# Patient Record
Sex: Female | Born: 1955 | Race: White | Hispanic: No | Marital: Married | Smoking: Current every day smoker
Health system: Southern US, Community
[De-identification: ages and names within clinical notes are randomized; demographics above are authoritative.]

## PROBLEM LIST (undated history)

## (undated) DIAGNOSIS — J449 Chronic obstructive pulmonary disease, unspecified: Secondary | ICD-10-CM

## (undated) DIAGNOSIS — C801 Malignant (primary) neoplasm, unspecified: Secondary | ICD-10-CM

## (undated) DIAGNOSIS — K219 Gastro-esophageal reflux disease without esophagitis: Secondary | ICD-10-CM

## (undated) HISTORY — PX: APPENDECTOMY: SHX54

## (undated) HISTORY — PX: PARTIAL HIP ARTHROPLASTY: SHX733

## (undated) HISTORY — PX: BREAST SURGERY: SHX581

## (undated) HISTORY — PX: ABDOMINAL HYSTERECTOMY: SHX81

---

## 2014-09-29 ENCOUNTER — Encounter (HOSPITAL_COMMUNITY): Payer: Self-pay | Admitting: Emergency Medicine

## 2014-09-29 ENCOUNTER — Emergency Department (HOSPITAL_COMMUNITY): Payer: Medicare Other

## 2014-09-29 ENCOUNTER — Inpatient Hospital Stay (HOSPITAL_COMMUNITY)
Admission: EM | Admit: 2014-09-29 | Discharge: 2014-10-05 | DRG: 481 | Disposition: A | Discharge status: Home / Self Care | Payer: Medicare Other | Attending: Orthopaedic Surgery | Admitting: Orthopaedic Surgery

## 2014-09-29 DIAGNOSIS — Z79899 Other long term (current) drug therapy: Secondary | ICD-10-CM | POA: Diagnosis not present

## 2014-09-29 DIAGNOSIS — Z23 Encounter for immunization: Secondary | ICD-10-CM | POA: Diagnosis not present

## 2014-09-29 DIAGNOSIS — K219 Gastro-esophageal reflux disease without esophagitis: Secondary | ICD-10-CM | POA: Diagnosis not present

## 2014-09-29 DIAGNOSIS — J449 Chronic obstructive pulmonary disease, unspecified: Secondary | ICD-10-CM | POA: Diagnosis present

## 2014-09-29 DIAGNOSIS — K5900 Constipation, unspecified: Secondary | ICD-10-CM | POA: Diagnosis not present

## 2014-09-29 DIAGNOSIS — F17210 Nicotine dependence, cigarettes, uncomplicated: Secondary | ICD-10-CM | POA: Diagnosis not present

## 2014-09-29 DIAGNOSIS — Z8541 Personal history of malignant neoplasm of cervix uteri: Secondary | ICD-10-CM | POA: Diagnosis not present

## 2014-09-29 DIAGNOSIS — W19XXXA Unspecified fall, initial encounter: Secondary | ICD-10-CM | POA: Diagnosis present

## 2014-09-29 DIAGNOSIS — Z853 Personal history of malignant neoplasm of breast: Secondary | ICD-10-CM

## 2014-09-29 DIAGNOSIS — Z86711 Personal history of pulmonary embolism: Secondary | ICD-10-CM | POA: Diagnosis present

## 2014-09-29 DIAGNOSIS — M978XXA Periprosthetic fracture around other internal prosthetic joint, initial encounter: Secondary | ICD-10-CM

## 2014-09-29 DIAGNOSIS — Y92019 Unspecified place in single-family (private) house as the place of occurrence of the external cause: Secondary | ICD-10-CM | POA: Diagnosis not present

## 2014-09-29 DIAGNOSIS — Z8543 Personal history of malignant neoplasm of ovary: Secondary | ICD-10-CM

## 2014-09-29 DIAGNOSIS — Z86718 Personal history of other venous thrombosis and embolism: Secondary | ICD-10-CM | POA: Diagnosis not present

## 2014-09-29 DIAGNOSIS — T84040A Periprosthetic fracture around internal prosthetic right hip joint, initial encounter: Secondary | ICD-10-CM | POA: Diagnosis present

## 2014-09-29 DIAGNOSIS — S72001A Fracture of unspecified part of neck of right femur, initial encounter for closed fracture: Principal | ICD-10-CM | POA: Diagnosis present

## 2014-09-29 DIAGNOSIS — Z8542 Personal history of malignant neoplasm of other parts of uterus: Secondary | ICD-10-CM | POA: Diagnosis not present

## 2014-09-29 DIAGNOSIS — S8990XA Unspecified injury of unspecified lower leg, initial encounter: Secondary | ICD-10-CM

## 2014-09-29 DIAGNOSIS — Z96649 Presence of unspecified artificial hip joint: Secondary | ICD-10-CM

## 2014-09-29 DIAGNOSIS — Z8781 Personal history of (healed) traumatic fracture: Secondary | ICD-10-CM

## 2014-09-29 DIAGNOSIS — S7291XA Unspecified fracture of right femur, initial encounter for closed fracture: Secondary | ICD-10-CM | POA: Diagnosis present

## 2014-09-29 DIAGNOSIS — Y92009 Unspecified place in unspecified non-institutional (private) residence as the place of occurrence of the external cause: Secondary | ICD-10-CM

## 2014-09-29 DIAGNOSIS — Z419 Encounter for procedure for purposes other than remedying health state, unspecified: Secondary | ICD-10-CM

## 2014-09-29 DIAGNOSIS — Z9889 Other specified postprocedural states: Secondary | ICD-10-CM

## 2014-09-29 HISTORY — DX: Gastro-esophageal reflux disease without esophagitis: K21.9

## 2014-09-29 HISTORY — DX: Chronic obstructive pulmonary disease, unspecified: J44.9

## 2014-09-29 HISTORY — DX: Malignant (primary) neoplasm, unspecified: C80.1

## 2014-09-29 LAB — URINALYSIS, ROUTINE W REFLEX MICROSCOPIC
Bilirubin Urine: NEGATIVE
Glucose, UA: NEGATIVE mg/dL
Hgb urine dipstick: NEGATIVE
KETONES UR: 15 mg/dL — AB
LEUKOCYTES UA: NEGATIVE
NITRITE: NEGATIVE
PROTEIN: NEGATIVE mg/dL
Specific Gravity, Urine: 1.018 (ref 1.005–1.030)
UROBILINOGEN UA: 0.2 mg/dL (ref 0.0–1.0)
pH: 5.5 (ref 5.0–8.0)

## 2014-09-29 LAB — CBC WITH DIFFERENTIAL/PLATELET
BASOS ABS: 0 10*3/uL (ref 0.0–0.1)
Basophils Relative: 0 % (ref 0–1)
Eosinophils Absolute: 0 10*3/uL (ref 0.0–0.7)
Eosinophils Relative: 0 % (ref 0–5)
HEMATOCRIT: 33.9 % — AB (ref 36.0–46.0)
Hemoglobin: 11.5 g/dL — ABNORMAL LOW (ref 12.0–15.0)
Lymphocytes Relative: 9 % — ABNORMAL LOW (ref 12–46)
Lymphs Abs: 1.1 10*3/uL (ref 0.7–4.0)
MCH: 33.1 pg (ref 26.0–34.0)
MCHC: 33.9 g/dL (ref 30.0–36.0)
MCV: 97.7 fL (ref 78.0–100.0)
MONO ABS: 0.7 10*3/uL (ref 0.1–1.0)
Monocytes Relative: 6 % (ref 3–12)
NEUTROS ABS: 10.1 10*3/uL — AB (ref 1.7–7.7)
Neutrophils Relative %: 85 % — ABNORMAL HIGH (ref 43–77)
PLATELETS: 227 10*3/uL (ref 150–400)
RBC: 3.47 MIL/uL — ABNORMAL LOW (ref 3.87–5.11)
RDW: 13.3 % (ref 11.5–15.5)
WBC: 11.9 10*3/uL — ABNORMAL HIGH (ref 4.0–10.5)

## 2014-09-29 LAB — BASIC METABOLIC PANEL
ANION GAP: 12 (ref 5–15)
BUN: 14 mg/dL (ref 6–23)
CALCIUM: 8.3 mg/dL — AB (ref 8.4–10.5)
CO2: 23 meq/L (ref 19–32)
CREATININE: 0.56 mg/dL (ref 0.50–1.10)
Chloride: 107 mEq/L (ref 96–112)
GFR calc Af Amer: >90 mL/min (ref >90)
Glucose, Bld: 109 mg/dL — ABNORMAL HIGH (ref 70–99)
Potassium: 3.8 mEq/L (ref 3.7–5.3)
SODIUM: 142 meq/L (ref 137–147)

## 2014-09-29 LAB — MRSA PCR SCREENING: MRSA BY PCR: NEGATIVE

## 2014-09-29 LAB — PROTIME-INR
INR: 1.21 (ref 0.00–1.49)
PROTHROMBIN TIME: 15.4 s — AB (ref 11.6–15.2)

## 2014-09-29 LAB — HEPATIC FUNCTION PANEL
ALT: 23 U/L (ref 0–35)
AST: 30 U/L (ref 0–37)
Albumin: 3.3 g/dL — ABNORMAL LOW (ref 3.5–5.2)
Alkaline Phosphatase: 51 U/L (ref 39–117)
TOTAL PROTEIN: 5.3 g/dL — AB (ref 6.0–8.3)
Total Bilirubin: 0.6 mg/dL (ref 0.3–1.2)

## 2014-09-29 LAB — ABO/RH: ABO/RH(D): B POS

## 2014-09-29 MED ORDER — MORPHINE SULFATE 4 MG/ML IJ SOLN
4.0000 mg | INTRAMUSCULAR | Status: DC | PRN
  Administered 2014-09-29 – 2014-10-01 (×4): 4 mg via INTRAVENOUS
  Filled 2014-09-29 (×4): qty 1

## 2014-09-29 MED ORDER — METHOCARBAMOL 500 MG PO TABS
500.0000 mg | ORAL_TABLET | Freq: Four times a day (QID) | ORAL | Status: DC | PRN
  Administered 2014-09-29 – 2014-10-05 (×14): 500 mg via ORAL
  Filled 2014-09-29 (×13): qty 1

## 2014-09-29 MED ORDER — SODIUM CHLORIDE 0.9 % IV SOLN
1000.0000 mL | INTRAVENOUS | Status: DC
  Administered 2014-09-29: 1000 mL via INTRAVENOUS

## 2014-09-29 MED ORDER — FENTANYL CITRATE 0.05 MG/ML IJ SOLN
100.0000 ug | Freq: Once | INTRAMUSCULAR | Status: AC
  Administered 2014-09-29: 100 ug via INTRAVENOUS
  Filled 2014-09-29: qty 2

## 2014-09-29 MED ORDER — SODIUM CHLORIDE 0.9 % IV SOLN
INTRAVENOUS | Status: DC
  Administered 2014-09-29: 16:00:00 via INTRAVENOUS

## 2014-09-29 MED ORDER — OXYCODONE HCL 5 MG PO TABS
5.0000 mg | ORAL_TABLET | ORAL | Status: DC | PRN
  Administered 2014-09-29: 10 mg via ORAL
  Filled 2014-09-29: qty 2

## 2014-09-29 MED ORDER — ONDANSETRON HCL 4 MG/2ML IJ SOLN: 4.0000 mg | Freq: Four times a day (QID) | INTRAMUSCULAR | Status: DC

## 2014-09-29 MED ORDER — MORPHINE BOLUS VIA INFUSION: 4.0000 mg | INTRAVENOUS | Status: DC | PRN

## 2014-09-29 MED ORDER — DOCUSATE SODIUM 100 MG PO CAPS
100.0000 mg | ORAL_CAPSULE | Freq: Two times a day (BID) | ORAL | Status: DC
  Administered 2014-09-29 – 2014-10-05 (×12): 100 mg via ORAL
  Filled 2014-09-29 (×18): qty 1

## 2014-09-29 MED ORDER — ENOXAPARIN SODIUM 40 MG/0.4ML ~~LOC~~ SOLN
40.0000 mg | SUBCUTANEOUS | Status: DC
  Administered 2014-09-29 – 2014-10-02 (×4): 40 mg via SUBCUTANEOUS
  Filled 2014-09-29 (×5): qty 0.4

## 2014-09-29 MED ORDER — ONDANSETRON HCL 4 MG/2ML IJ SOLN
4.0000 mg | Freq: Four times a day (QID) | INTRAMUSCULAR | Status: DC | PRN
  Administered 2014-09-29: 4 mg via INTRAVENOUS
  Filled 2014-09-29: qty 2

## 2014-09-29 MED ORDER — MORPHINE SULFATE 4 MG/ML IJ SOLN
4.0000 mg | Freq: Once | INTRAMUSCULAR | Status: AC
  Administered 2014-09-29: 4 mg via INTRAVENOUS
  Filled 2014-09-29: qty 1

## 2014-09-29 MED ORDER — HYDROCODONE-ACETAMINOPHEN 5-325 MG PO TABS
1.0000 | ORAL_TABLET | Freq: Four times a day (QID) | ORAL | Status: DC | PRN
  Administered 2014-09-29 – 2014-10-05 (×13): 2 via ORAL
  Filled 2014-09-29 (×13): qty 2

## 2014-09-29 MED ORDER — METHOCARBAMOL 1000 MG/10ML IJ SOLN
500.0000 mg | Freq: Four times a day (QID) | INTRAVENOUS | Status: DC | PRN
  Filled 2014-09-29: qty 5

## 2014-09-29 MED ORDER — SODIUM CHLORIDE 0.9 % IV BOLUS (SEPSIS)
1000.0000 mL | Freq: Once | INTRAVENOUS | Status: AC
  Administered 2014-09-29: 1000 mL via INTRAVENOUS

## 2014-09-29 NOTE — ED Notes (Signed)
Patient transported to X-ray 

## 2014-09-29 NOTE — ED Notes (Signed)
Orthopedic surgery at bedside

## 2014-09-29 NOTE — ED Notes (Addendum)
Felt right hip give away while walking and fell, EMS reports possible femur fracture. They placed the right leg in traction. Pulses positive. Swelling to right upper leg. 274mcg Fentanyl; 4mg  Zofran given in route. Fell onto tile floor, denies hitting head or any LOC. Patient reports bilat hip replacements three times; most recently the right hip in 2010.

## 2014-09-29 NOTE — ED Notes (Signed)
Dr. Lorin Mercy removed EMS traction device.

## 2014-09-29 NOTE — H&P (Addendum)
Diana Wheeler is an 58 y.o. female.   Chief Complaint: fall with right periprothetic femur fracture.   Previous THA times 3.  ( 2 revisions) she states she used to weigh 527 lbs and gradually lost over 3 yrs down to 145 lbs.     [address] getting up to go to bathroom.  Femur cracked.  HPI: last revision for right hip was 2010 for poly wear.   Past Medical History  Diagnosis Date  . COPD (chronic obstructive pulmonary disease)   . Cancer     cervical, ovarina, uterine, breast  . GERD (gastroesophageal reflux disease)     Past Surgical History  Procedure Laterality Date  . Breast surgery    . Abdominal hysterectomy    . Appendectomy    . Partial hip arthroplasty      reports bilat x3     Previous breat CA with breast biopsy then  then implants History reviewed. No pertinent family history. Social History:  reports that she has been smoking Cigarettes.  She has been smoking about 0.00 packs per day. She does not have any smokeless tobacco history on file. She reports that she drinks alcohol. She reports that she does not use illicit drugs.  Allergies: No Known Allergies   (Not in a hospital admission)  Results for orders placed or performed during the hospital encounter of 09/29/14 (from the past 48 hour(s))  Urinalysis, Routine w reflex microscopic     Status: Abnormal   Collection Time: 09/29/14 12:20 PM  Result Value Ref Range   Color, Urine YELLOW YELLOW   APPearance CLEAR CLEAR   Specific Gravity, Urine 1.018 1.005 - 1.030   pH 5.5 5.0 - 8.0   Glucose, UA NEGATIVE NEGATIVE mg/dL   Hgb urine dipstick NEGATIVE NEGATIVE   Bilirubin Urine NEGATIVE NEGATIVE   Ketones, ur 15 (A) NEGATIVE mg/dL   Protein, ur NEGATIVE NEGATIVE mg/dL   Urobilinogen, UA 0.2 0.0 - 1.0 mg/dL   Nitrite NEGATIVE NEGATIVE   Leukocytes, UA NEGATIVE NEGATIVE    Comment: MICROSCOPIC NOT DONE ON URINES WITH NEGATIVE PROTEIN, BLOOD, LEUKOCYTES, NITRITE, OR GLUCOSE <1000 mg/dL.  CBC WITH DIFFERENTIAL      Status: Abnormal   Collection Time: 09/29/14  2:47 PM  Result Value Ref Range   WBC 11.9 (H) 4.0 - 10.5 K/uL   RBC 3.47 (L) 3.87 - 5.11 MIL/uL   Hemoglobin 11.5 (L) 12.0 - 15.0 g/dL   HCT 33.9 (L) 36.0 - 46.0 %   MCV 97.7 78.0 - 100.0 fL   MCH 33.1 26.0 - 34.0 pg   MCHC 33.9 30.0 - 36.0 g/dL   RDW 13.3 11.5 - 15.5 %   Platelets 227 150 - 400 K/uL   Neutrophils Relative % 85 (H) 43 - 77 %   Neutro Abs 10.1 (H) 1.7 - 7.7 K/uL   Lymphocytes Relative 9 (L) 12 - 46 %   Lymphs Abs 1.1 0.7 - 4.0 K/uL   Monocytes Relative 6 3 - 12 %   Monocytes Absolute 0.7 0.1 - 1.0 K/uL   Eosinophils Relative 0 0 - 5 %   Eosinophils Absolute 0.0 0.0 - 0.7 K/uL   Basophils Relative 0 0 - 1 %   Basophils Absolute 0.0 0.0 - 0.1 K/uL   Dg Pelvis 1-2 Views  09/29/2014   CLINICAL DATA:  Patient was walking and fell pain in the right hip.  EXAM: PELVIS - 1-2 VIEW  COMPARISON:  None.  FINDINGS: Bilateral hip replacements are noted. No  acute fracture or dislocation is noted. No soft tissue abnormality is seen.  IMPRESSION: No acute abnormality noted.   Electronically Signed   By: Inez Catalina M.D.   On: 09/29/2014 14:50   Dg Femur Right  09/29/2014   CLINICAL DATA:  Walking with sudden pain and inability to support weight, additional encounter  EXAM: RIGHT FEMUR - 2 VIEW  COMPARISON:  None.  FINDINGS: There is a periprosthetic fracture of the proximal right femoral diaphysis. Angulation at the fracture site is noted. No other fractures are seen. Curvilinear densities are noted within the soft tissues in the right inguinal region and proximal thigh of uncertain significance. No other focal abnormality is noted.  IMPRESSION: Proximal right femoral fracture surrounding the distal aspect of the prosthesis.   Electronically Signed   By: Inez Catalina M.D.   On: 09/29/2014 14:53    ROS  Blood pressure 107/55, pulse 81, temperature 98.1 F (36.7 C), temperature source Oral, resp. rate 20, SpO2 96 %. Physical Exam   Constitutional: She is oriented to person, place, and time. She appears well-developed and well-nourished.  HENT:  Head: Normocephalic.  Upper and lower dentures  Neck:  Previous ACDF  Cardiovascular: Normal rate and regular rhythm.   Respiratory: Effort normal.  GI: Soft.  Musculoskeletal:  Right leg short and ER. Pulses 2 plus  Neurological: She is alert and oriented to person, place, and time.  Skin: Skin is warm and dry.  Psychiatric: She has a normal mood and affect. Her behavior is normal. Thought content normal.     Assessment/Plan righrt periprothetic femur fracture. Plan admission , will need ORIF of right femur. Cable plate system will be required. Discussed with pt and discussed.  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 WILL NEED COUMADIN OR XERALTO  POST OP Diana Wheeler C 09/29/2014, 3:21 PM

## 2014-09-29 NOTE — ED Notes (Signed)
Consent signed by patient with Dr. Lorin Mercy at bedside.

## 2014-09-29 NOTE — Progress Notes (Signed)
Orthopedic Tech Progress Note Patient Details:  Diana Wheeler 24-Dec-1955 [id number]  Musculoskeletal Traction Type of Traction: [place] Skin Traction Traction Location: RLE Traction Weight: 5 lbs    Braulio Bosch 09/29/2014, 5:47 PM

## 2014-09-29 NOTE — ED Notes (Signed)
Patient returned from X-ray 

## 2014-09-29 NOTE — ED Provider Notes (Signed)
CSN: [id number]     Arrival date & time 09/29/14  1153 History   First MD Initiated Contact with Patient 09/29/14 1211     Chief Complaint  Patient presents with  . Leg Injury  . Hip Pain      HPI Comments: Ms. Hyslop presents for right hip pain following fall. She is here visiting her sister when she went up to use the bathroom and felt her right hip pop and she fell to the ground. This started just prior to ED arrival. She reports a lot of pain in the right hip and thigh with a large amount of swelling locally. She states that her leg feels better after being placed in traction by EMS. She has a history of prior bilateral hip replacements. She denies history of dislocation, but has been having problems with that hip recently. She did not hit her head or pass out. She reports a medical history of low blood pressures.  Symptoms are moderate, constant, and nonradiating.  Patient is a 58 y.o. female presenting with hip pain. The history is provided by the patient.  Hip Pain    Past Medical History  Diagnosis Date  . COPD (chronic obstructive pulmonary disease)   . Cancer     cervical, ovarina, uterine, breast  . GERD (gastroesophageal reflux disease)    Past Surgical History  Procedure Laterality Date  . Breast surgery    . Abdominal hysterectomy    . Appendectomy    . Partial hip arthroplasty      reports bilat x3   History reviewed. No pertinent family history. History  Substance Use Topics  . Smoking status: Current Every Day Smoker    Types: Cigarettes  . Smokeless tobacco: Not on file  . Alcohol Use: Yes     Comment: occasionally   OB History    No data available     Review of Systems  All other systems reviewed and are negative.     Allergies  Review of patient's allergies indicates no known allergies.  Home Medications   Prior to Admission medications   Not on File   BP 100/54 mmHg  Pulse 91  Temp(Src) 98.5 F (36.9 C) (Oral)  Resp 20  SpO2  92% Physical Exam  Constitutional: She is oriented to person, place, and time. She appears well-developed and well-nourished. She appears distressed.  HENT:  Head: Normocephalic and atraumatic.  Cardiovascular: Normal rate and regular rhythm.   No murmur heard. Pulmonary/Chest: Effort normal and breath sounds normal. No respiratory distress.  Abdominal: Soft. There is no tenderness. There is no rebound and no guarding.  Musculoskeletal:  2+ femoral and DP pulses bilaterally. Moderate tenderness over the right hip and thigh. Right lower extremity in traction.  Neurological: She is alert and oriented to person, place, and time.  Sensation to light touch intact in bilateral lower extremities  Skin: Skin is warm and dry.  Psychiatric: She has a normal mood and affect. Her behavior is normal.  Nursing note and vitals reviewed.   ED Course  Procedures (including critical care time) Labs Review Labs Reviewed  URINALYSIS, ROUTINE W REFLEX MICROSCOPIC - Abnormal; Notable for the following:    Ketones, ur 15 (*)    All other components within normal limits  BASIC METABOLIC PANEL - Abnormal; Notable for the following:    Glucose, Bld 109 (*)    Calcium 8.3 (*)    All other components within normal limits  CBC WITH DIFFERENTIAL - Abnormal; Notable  for the following:    WBC 11.9 (*)    RBC 3.47 (*)    Hemoglobin 11.5 (*)    HCT 33.9 (*)    Neutrophils Relative % 85 (*)    Neutro Abs 10.1 (*)    Lymphocytes Relative 9 (*)    All other components within normal limits  PROTIME-INR - Abnormal; Notable for the following:    Prothrombin Time 15.4 (*)    All other components within normal limits  HEPATIC FUNCTION PANEL - Abnormal; Notable for the following:    Total Protein 5.3 (*)    Albumin 3.3 (*)    All other components within normal limits  MRSA PCR SCREENING  VITAMIN D 25 HYDROXY  TYPE AND SCREEN  ABO/RH  PREPARE RBC (CROSSMATCH)    Imaging Review Dg Pelvis 1-2  Views  09/29/2014   CLINICAL DATA:  Patient was walking and fell pain in the right hip.  EXAM: PELVIS - 1-2 VIEW  COMPARISON:  None.  FINDINGS: Bilateral hip replacements are noted. No acute fracture or dislocation is noted. No soft tissue abnormality is seen.  IMPRESSION: No acute abnormality noted.   Electronically Signed   By: Inez Catalina M.D.   On: 09/29/2014 14:50   Dg Femur Right  09/29/2014   CLINICAL DATA:  Walking with sudden pain and inability to support weight, additional encounter  EXAM: RIGHT FEMUR - 2 VIEW  COMPARISON:  None.  FINDINGS: There is a periprosthetic fracture of the proximal right femoral diaphysis. Angulation at the fracture site is noted. No other fractures are seen. Curvilinear densities are noted within the soft tissues in the right inguinal region and proximal thigh of uncertain significance. No other focal abnormality is noted.  IMPRESSION: Proximal right femoral fracture surrounding the distal aspect of the prosthesis.   Electronically Signed   By: Inez Catalina M.D.   On: 09/29/2014 14:53   Dg Chest Port 1 View  09/29/2014   CLINICAL DATA:  58 year old for preoperative respiratory examination for right hip surgery.  EXAM: PORTABLE CHEST - 1 VIEW  COMPARISON:  None.  FINDINGS: The cardiomediastinal silhouette is unremarkable.  There is no evidence of focal airspace disease, pulmonary edema, suspicious pulmonary nodule/mass, pleural effusion, or pneumothorax. No acute bony abnormalities are identified.  Lower cervical fusion changes noted.  IMPRESSION: No active disease.   Electronically Signed   By: Hassan Rowan M.D.   On: 09/29/2014 15:36     EKG Interpretation   Date/Time:  Saturday September 29 2014 16:05:10 EST Ventricular Rate:  95 PR Interval:  141 QRS Duration: 80 QT Interval:  341 QTC Calculation: 429 R Axis:   79 Text Interpretation:  Sinus rhythm Low voltage, extremity and precordial  leads Nonspecific ST abnormality Confirmed by Hazle Coca [phone]) on   09/29/2014 4:17:17 PM     Patient with right periprosthetic femur fracture. Discussed with Dr. Lorin Mercy with orthopedics he will come in to see the patient. MDM   Final diagnoses:  Fall at home, initial encounter  Peri-prosthetic femur fracture at tip of prosthesis, initial encounter    Patient here for evaluation of hip injury following fall. Patient has no evidence of additional injuries and denies hitting her head. She did not have any syncope or presyncopal symptoms prior to falling.    Quintella Reichert, MD 09/30/14 [phone]

## 2014-09-30 ENCOUNTER — Inpatient Hospital Stay (HOSPITAL_COMMUNITY): Payer: Medicare Other

## 2014-09-30 ENCOUNTER — Inpatient Hospital Stay (HOSPITAL_COMMUNITY): Payer: Medicare Other | Admitting: Anesthesiology

## 2014-09-30 ENCOUNTER — Encounter (HOSPITAL_COMMUNITY)
Admission: EM | Disposition: A | Discharge status: Home / Self Care | Payer: Self-pay | Source: Home / Self Care | Attending: Orthopaedic Surgery

## 2014-09-30 ENCOUNTER — Encounter (HOSPITAL_COMMUNITY): Payer: Self-pay | Admitting: Certified Registered"

## 2014-09-30 DIAGNOSIS — Z86711 Personal history of pulmonary embolism: Secondary | ICD-10-CM | POA: Diagnosis present

## 2014-09-30 HISTORY — PX: ORIF HIP FRACTURE: SHX2125

## 2014-09-30 LAB — POCT I-STAT 4, (NA,K, GLUC, HGB,HCT)
Glucose, Bld: 112 mg/dL — ABNORMAL HIGH (ref 70–99)
Glucose, Bld: 113 mg/dL — ABNORMAL HIGH (ref 70–99)
HCT: 31 % — ABNORMAL LOW (ref 36.0–46.0)
HEMATOCRIT: 30 % — AB (ref 36.0–46.0)
HEMOGLOBIN: 10.2 g/dL — AB (ref 12.0–15.0)
Hemoglobin: 10.5 g/dL — ABNORMAL LOW (ref 12.0–15.0)
Potassium: 4.1 mEq/L (ref 3.7–5.3)
Potassium: 3.7 mEq/L (ref 3.7–5.3)
Sodium: 138 mEq/L (ref 137–147)
Sodium: 139 mEq/L (ref 137–147)

## 2014-09-30 LAB — PREPARE RBC (CROSSMATCH)

## 2014-09-30 SURGERY — OPEN REDUCTION INTERNAL FIXATION HIP
Anesthesia: General | Site: Hip | Laterality: Right

## 2014-09-30 MED ORDER — ACETAMINOPHEN 650 MG RE SUPP: 650.0000 mg | Freq: Four times a day (QID) | RECTAL | Status: DC | PRN

## 2014-09-30 MED ORDER — CLONAZEPAM 0.5 MG PO TABS
0.5000 mg | ORAL_TABLET | Freq: Every day | ORAL | Status: DC
  Administered 2014-09-30 – 2014-10-05 (×6): 0.5 mg via ORAL
  Filled 2014-09-30 (×6): qty 1

## 2014-09-30 MED ORDER — POLYETHYLENE GLYCOL 3350 17 G PO PACK
17.0000 g | PACK | Freq: Every day | ORAL | Status: DC | PRN
  Administered 2014-09-30 – 2014-10-05 (×5): 17 g via ORAL
  Filled 2014-09-30 (×6): qty 1

## 2014-09-30 MED ORDER — VECURONIUM BROMIDE 10 MG IV SOLR
INTRAVENOUS | Status: AC
  Filled 2014-09-30: qty 10

## 2014-09-30 MED ORDER — SODIUM CHLORIDE 0.45 % IV SOLN
INTRAVENOUS | Status: DC
  Administered 2014-09-30: 23:00:00 via INTRAVENOUS

## 2014-09-30 MED ORDER — LACTATED RINGERS IV SOLN
INTRAVENOUS | Status: DC | PRN
  Administered 2014-09-30 (×4): via INTRAVENOUS

## 2014-09-30 MED ORDER — EPHEDRINE SULFATE 50 MG/ML IJ SOLN
INTRAMUSCULAR | Status: DC | PRN
  Administered 2014-09-30: 15 mg via INTRAVENOUS
  Administered 2014-09-30: 10 mg via INTRAVENOUS

## 2014-09-30 MED ORDER — POTASSIUM CHLORIDE CRYS ER 20 MEQ PO TBCR: 20.0000 meq | EXTENDED_RELEASE_TABLET | Freq: Every day | ORAL | Status: DC | PRN

## 2014-09-30 MED ORDER — ONDANSETRON HCL 4 MG/2ML IJ SOLN
INTRAMUSCULAR | Status: DC | PRN
  Administered 2014-09-30: 4 mg via INTRAVENOUS

## 2014-09-30 MED ORDER — COUMADIN BOOK
Freq: Once | Status: AC
  Administered 2014-09-30: 14:00:00
  Filled 2014-09-30: qty 1

## 2014-09-30 MED ORDER — ACETAMINOPHEN 325 MG PO TABS
ORAL_TABLET | ORAL | Status: AC
  Administered 2014-09-30: 650 mg via ORAL
  Filled 2014-09-30: qty 2

## 2014-09-30 MED ORDER — ONDANSETRON HCL 4 MG/2ML IJ SOLN
4.0000 mg | Freq: Four times a day (QID) | INTRAMUSCULAR | Status: DC | PRN
Start: 2014-09-30 — End: 2014-09-30

## 2014-09-30 MED ORDER — GLYCOPYRROLATE 0.2 MG/ML IJ SOLN
INTRAMUSCULAR | Status: DC | PRN
  Administered 2014-09-30: 0.4 mg via INTRAVENOUS

## 2014-09-30 MED ORDER — CEFAZOLIN SODIUM-DEXTROSE 2-3 GM-% IV SOLR
2.0000 g | Freq: Four times a day (QID) | INTRAVENOUS | Status: AC
Start: 2014-09-30 — End: 2014-09-30
  Administered 2014-09-30 (×2): 2 g via INTRAVENOUS
  Filled 2014-09-30 (×2): qty 50

## 2014-09-30 MED ORDER — ONDANSETRON HCL 4 MG/2ML IJ SOLN
4.0000 mg | Freq: Four times a day (QID) | INTRAMUSCULAR | Status: DC | PRN
  Administered 2014-09-30: 4 mg via INTRAVENOUS
  Filled 2014-09-30 (×2): qty 2

## 2014-09-30 MED ORDER — BISACODYL 10 MG RE SUPP: 10.0000 mg | Freq: Every day | RECTAL | Status: DC | PRN

## 2014-09-30 MED ORDER — PHENOL 1.4 % MT LIQD: 1.0000 | OROMUCOSAL | Status: DC | PRN

## 2014-09-30 MED ORDER — ROCURONIUM BROMIDE 100 MG/10ML IV SOLN
INTRAVENOUS | Status: DC | PRN
  Administered 2014-09-30: 40 mg via INTRAVENOUS

## 2014-09-30 MED ORDER — BUPIVACAINE HCL (PF) 0.25 % IJ SOLN
INTRAMUSCULAR | Status: DC | PRN
  Administered 2014-09-30: 12 mL

## 2014-09-30 MED ORDER — METOCLOPRAMIDE HCL 5 MG/ML IJ SOLN: 5.0000 mg | Freq: Three times a day (TID) | INTRAMUSCULAR | Status: DC | PRN

## 2014-09-30 MED ORDER — FLEET ENEMA 7-19 GM/118ML RE ENEM: 1.0000 | ENEMA | Freq: Once | RECTAL | Status: AC | PRN

## 2014-09-30 MED ORDER — LIDOCAINE HCL (CARDIAC) 20 MG/ML IV SOLN
INTRAVENOUS | Status: DC | PRN
  Administered 2014-09-30: 60 mg via INTRAVENOUS

## 2014-09-30 MED ORDER — FUROSEMIDE 40 MG PO TABS
40.0000 mg | ORAL_TABLET | Freq: Every day | ORAL | Status: DC | PRN
  Filled 2014-09-30: qty 1

## 2014-09-30 MED ORDER — MENTHOL 3 MG MT LOZG: 1.0000 | LOZENGE | OROMUCOSAL | Status: DC | PRN

## 2014-09-30 MED ORDER — ONDANSETRON HCL 4 MG/2ML IJ SOLN
INTRAMUSCULAR | Status: AC
  Filled 2014-09-30: qty 2

## 2014-09-30 MED ORDER — NEOSTIGMINE METHYLSULFATE 10 MG/10ML IV SOLN
INTRAVENOUS | Status: AC
  Filled 2014-09-30: qty 1

## 2014-09-30 MED ORDER — DEXAMETHASONE SODIUM PHOSPHATE 4 MG/ML IJ SOLN
INTRAMUSCULAR | Status: AC
  Filled 2014-09-30: qty 1

## 2014-09-30 MED ORDER — PHENYLEPHRINE HCL 10 MG/ML IJ SOLN
INTRAMUSCULAR | Status: DC | PRN
  Administered 2014-09-30: 80 ug via INTRAVENOUS

## 2014-09-30 MED ORDER — HYDROMORPHONE HCL 1 MG/ML IJ SOLN
INTRAMUSCULAR | Status: AC
  Filled 2014-09-30: qty 1

## 2014-09-30 MED ORDER — OXYCODONE HCL 5 MG PO TABS: 5.0000 mg | ORAL_TABLET | Freq: Once | ORAL | Status: DC | PRN

## 2014-09-30 MED ORDER — CEFAZOLIN SODIUM-DEXTROSE 2-3 GM-% IV SOLR
INTRAVENOUS | Status: DC | PRN
  Administered 2014-09-30: 2 g via INTRAVENOUS

## 2014-09-30 MED ORDER — VECURONIUM BROMIDE 10 MG IV SOLR
INTRAVENOUS | Status: DC | PRN
  Administered 2014-09-30: 3 mg via INTRAVENOUS
  Administered 2014-09-30: 1 mg via INTRAVENOUS

## 2014-09-30 MED ORDER — TIOTROPIUM BROMIDE MONOHYDRATE 18 MCG IN CAPS
18.0000 ug | ORAL_CAPSULE | Freq: Every day | RESPIRATORY_TRACT | Status: DC
  Administered 2014-09-30 – 2014-10-05 (×6): 18 ug via RESPIRATORY_TRACT
  Filled 2014-09-30 (×2): qty 5

## 2014-09-30 MED ORDER — METOCLOPRAMIDE HCL 10 MG PO TABS: 5.0000 mg | ORAL_TABLET | Freq: Three times a day (TID) | ORAL | Status: DC | PRN

## 2014-09-30 MED ORDER — BUPIVACAINE HCL (PF) 0.25 % IJ SOLN
INTRAMUSCULAR | Status: AC
  Filled 2014-09-30: qty 30

## 2014-09-30 MED ORDER — NEOSTIGMINE METHYLSULFATE 10 MG/10ML IV SOLN
INTRAVENOUS | Status: DC | PRN
  Administered 2014-09-30: 2 mg via INTRAVENOUS

## 2014-09-30 MED ORDER — HYDROCODONE-ACETAMINOPHEN 5-325 MG PO TABS: 1.0000 | ORAL_TABLET | Freq: Four times a day (QID) | ORAL | Status: DC | PRN

## 2014-09-30 MED ORDER — OXYCODONE HCL 5 MG PO TABS
5.0000 mg | ORAL_TABLET | ORAL | Status: DC | PRN
  Administered 2014-09-30 – 2014-10-05 (×17): 10 mg via ORAL
  Filled 2014-09-30 (×16): qty 2

## 2014-09-30 MED ORDER — WARFARIN VIDEO: Freq: Once | Status: DC

## 2014-09-30 MED ORDER — MIDAZOLAM HCL 2 MG/2ML IJ SOLN
INTRAMUSCULAR | Status: AC
Start: 2014-09-30 — End: 2014-09-30
  Filled 2014-09-30: qty 2

## 2014-09-30 MED ORDER — METHOCARBAMOL 500 MG PO TABS
ORAL_TABLET | ORAL | Status: AC
  Administered 2014-09-30: 500 mg via ORAL
  Filled 2014-09-30: qty 1

## 2014-09-30 MED ORDER — PHENYLEPHRINE HCL 10 MG/ML IJ SOLN
10.0000 mg | INTRAVENOUS | Status: DC | PRN
  Administered 2014-09-30: 80 ug/min via INTRAVENOUS

## 2014-09-30 MED ORDER — ZOLPIDEM TARTRATE 5 MG PO TABS: 5.0000 mg | ORAL_TABLET | Freq: Every evening | ORAL | Status: DC | PRN

## 2014-09-30 MED ORDER — WARFARIN - PHARMACIST DOSING INPATIENT: Freq: Every day | Status: DC

## 2014-09-30 MED ORDER — DEXAMETHASONE SODIUM PHOSPHATE 4 MG/ML IJ SOLN
INTRAMUSCULAR | Status: DC | PRN
  Administered 2014-09-30: 4 mg via INTRAVENOUS

## 2014-09-30 MED ORDER — WARFARIN SODIUM 5 MG PO TABS
5.0000 mg | ORAL_TABLET | Freq: Once | ORAL | Status: AC
  Administered 2014-09-30: 5 mg via ORAL
  Filled 2014-09-30: qty 1

## 2014-09-30 MED ORDER — ONDANSETRON HCL 4 MG PO TABS
4.0000 mg | ORAL_TABLET | Freq: Four times a day (QID) | ORAL | Status: DC | PRN
  Administered 2014-09-30: 4 mg via ORAL
  Filled 2014-09-30: qty 1

## 2014-09-30 MED ORDER — HYDROMORPHONE HCL 1 MG/ML IJ SOLN
0.2500 mg | INTRAMUSCULAR | Status: DC | PRN
  Administered 2014-09-30 (×3): 0.5 mg via INTRAVENOUS

## 2014-09-30 MED ORDER — FENTANYL CITRATE 0.05 MG/ML IJ SOLN
INTRAMUSCULAR | Status: AC
  Filled 2014-09-30: qty 5

## 2014-09-30 MED ORDER — FENTANYL CITRATE 0.05 MG/ML IJ SOLN
INTRAMUSCULAR | Status: DC | PRN
  Administered 2014-09-30: 50 ug via INTRAVENOUS
  Administered 2014-09-30: 100 ug via INTRAVENOUS
  Administered 2014-09-30 (×2): 50 ug via INTRAVENOUS

## 2014-09-30 MED ORDER — DOCUSATE SODIUM 100 MG PO CAPS: 100.0000 mg | ORAL_CAPSULE | Freq: Two times a day (BID) | ORAL | Status: DC

## 2014-09-30 MED ORDER — OXYCODONE HCL 5 MG/5ML PO SOLN: 5.0000 mg | Freq: Once | ORAL | Status: DC | PRN

## 2014-09-30 MED ORDER — PROPOFOL 10 MG/ML IV BOLUS
INTRAVENOUS | Status: DC | PRN
  Administered 2014-09-30: 130 mg via INTRAVENOUS

## 2014-09-30 MED ORDER — HYDROMORPHONE HCL 1 MG/ML IJ SOLN
INTRAMUSCULAR | Status: AC
  Administered 2014-09-30: 0.5 mg via INTRAVENOUS
  Filled 2014-09-30: qty 1

## 2014-09-30 MED ORDER — ALBUTEROL SULFATE (2.5 MG/3ML) 0.083% IN NEBU: 2.5000 mg | INHALATION_SOLUTION | Freq: Four times a day (QID) | RESPIRATORY_TRACT | Status: DC | PRN

## 2014-09-30 MED ORDER — MIDAZOLAM HCL 5 MG/5ML IJ SOLN
INTRAMUSCULAR | Status: DC | PRN
  Administered 2014-09-30: 2 mg via INTRAVENOUS

## 2014-09-30 MED ORDER — GLYCOPYRROLATE 0.2 MG/ML IJ SOLN
INTRAMUSCULAR | Status: AC
  Filled 2014-09-30: qty 2

## 2014-09-30 MED ORDER — ACETAMINOPHEN 325 MG PO TABS
650.0000 mg | ORAL_TABLET | Freq: Four times a day (QID) | ORAL | Status: DC | PRN
  Administered 2014-09-30: 650 mg via ORAL

## 2014-09-30 MED ORDER — HYDROMORPHONE HCL 1 MG/ML IJ SOLN
0.1000 mg | INTRAMUSCULAR | Status: DC | PRN
  Administered 2014-09-30: 0.1 mg via INTRAVENOUS
  Filled 2014-09-30: qty 1

## 2014-09-30 MED ORDER — ALBUTEROL SULFATE HFA 108 (90 BASE) MCG/ACT IN AERS: 2.0000 | INHALATION_SPRAY | Freq: Four times a day (QID) | RESPIRATORY_TRACT | Status: DC | PRN

## 2014-09-30 MED ORDER — OXYCODONE HCL 5 MG PO TABS
ORAL_TABLET | ORAL | Status: AC
  Administered 2014-09-30: 10 mg via ORAL
  Filled 2014-09-30: qty 2

## 2014-09-30 SURGICAL SUPPLY — 49 items
BIT DRILL 4.8MMDIAX5IN DISPOSE (BIT) ×1 IMPLANT
CABLE FOR BONE PLATE (Cable) ×18 IMPLANT
CUFF TOURNIQUET SINGLE 34IN LL (TOURNIQUET CUFF) IMPLANT
CUFF TOURNIQUET SINGLE 44IN (TOURNIQUET CUFF) IMPLANT
DRAPE IMP U-DRAPE 54X76 (DRAPES) ×3 IMPLANT
DRAPE STERI IOBAN 125X83 (DRAPES) ×3 IMPLANT
DRAPE SURG 17X23 STRL (DRAPES) ×3 IMPLANT
DRILL BIT 4.8MMDIAX5IN DISPOSE (BIT) ×3
DRSG ADAPTIC 3X8 NADH LF (GAUZE/BANDAGES/DRESSINGS) ×3 IMPLANT
DRSG PAD ABDOMINAL 8X10 ST (GAUZE/BANDAGES/DRESSINGS) ×3 IMPLANT
DURAPREP 26ML APPLICATOR (WOUND CARE) ×3 IMPLANT
ELECT CAUTERY BLADE 6.4 (BLADE) ×3 IMPLANT
ELECT REM PT RETURN 9FT ADLT (ELECTROSURGICAL) ×3
ELECTRODE REM PT RTRN 9FT ADLT (ELECTROSURGICAL) ×1 IMPLANT
EVACUATOR 1/8 PVC DRAIN (DRAIN) IMPLANT
GAUZE SPONGE 4X4 12PLY STRL (GAUZE/BANDAGES/DRESSINGS) ×3 IMPLANT
GAUZE XEROFORM 1X8 LF (GAUZE/BANDAGES/DRESSINGS) ×3 IMPLANT
GLOVE BIOGEL PI IND STRL 7.5 (GLOVE) ×3 IMPLANT
GLOVE BIOGEL PI IND STRL 8 (GLOVE) ×2 IMPLANT
GLOVE BIOGEL PI INDICATOR 7.5 (GLOVE) ×6
GLOVE BIOGEL PI INDICATOR 8 (GLOVE) ×4
GLOVE ECLIPSE 7.0 STRL STRAW (GLOVE) ×9 IMPLANT
GLOVE ORTHO TXT STRL SZ7.5 (GLOVE) ×3 IMPLANT
GOWN STRL REUS W/ TWL LRG LVL3 (GOWN DISPOSABLE) ×3 IMPLANT
GOWN STRL REUS W/TWL LRG LVL3 (GOWN DISPOSABLE) ×6
HEAD MODULAR 50MMX21MMX57MM (Orthopedic Implant) ×1 IMPLANT
KIT BASIN OR (CUSTOM PROCEDURE TRAY) ×3 IMPLANT
KIT ROOM TURNOVER OR (KITS) ×3 IMPLANT
MANIFOLD NEPTUNE II (INSTRUMENTS) ×3 IMPLANT
MODULAR HEAD 50MMX21MMX57MM (Orthopedic Implant) ×3 IMPLANT
NS IRRIG 1000ML POUR BTL (IV SOLUTION) ×3 IMPLANT
PACK GENERAL/GYN (CUSTOM PROCEDURE TRAY) ×3 IMPLANT
PACK UNIVERSAL I (CUSTOM PROCEDURE TRAY) ×3 IMPLANT
PAD ARMBOARD 7.5X6 YLW CONV (MISCELLANEOUS) ×9 IMPLANT
PLATE CABLE BONE 6HOLE (Plate) ×3 IMPLANT
SCREW CORT 3.5X38X4.5XST LG (Screw) ×1 IMPLANT
SCREW CORTICAL 4.5X38MM (Screw) ×2 IMPLANT
SPONGE GAUZE 4X4 12PLY STER LF (GAUZE/BANDAGES/DRESSINGS) ×3 IMPLANT
STAPLER VISISTAT 35W (STAPLE) ×3 IMPLANT
SUT VIC AB 0 CT1 27 (SUTURE) ×12
SUT VIC AB 0 CT1 27XBRD ANBCTR (SUTURE) ×6 IMPLANT
SUT VIC AB 1 CT1 27 (SUTURE) ×2
SUT VIC AB 1 CT1 27XBRD ANBCTR (SUTURE) ×1 IMPLANT
SUT VIC AB 2-0 CT1 27 (SUTURE) ×2
SUT VIC AB 2-0 CT1 TAPERPNT 27 (SUTURE) ×1 IMPLANT
TAPE CLOTH SURG 6X10 WHT LF (GAUZE/BANDAGES/DRESSINGS) ×3 IMPLANT
TOWEL OR 17X24 6PK STRL BLUE (TOWEL DISPOSABLE) ×3 IMPLANT
TOWEL OR 17X26 10 PK STRL BLUE (TOWEL DISPOSABLE) ×3 IMPLANT
WATER STERILE IRR 1000ML POUR (IV SOLUTION) ×3 IMPLANT

## 2014-09-30 NOTE — Op Note (Signed)
Diana Wheeler, Diana Wheeler               ACCOUNT NO.:  [id number]  MEDICAL RECORD NO.:  [id number]  LOCATION:  5N29C                        FACILITY:  [facility]  PHYSICIAN:  Dickie Cloe C. Lorin Mercy, M.D.    DATE OF BIRTH:  Jun 03, 1956  DATE OF PROCEDURE:  09/30/2014 DATE OF DISCHARGE:                              OPERATIVE REPORT   PREOPERATIVE DIAGNOSIS:  Right periprosthetic femoral shaft fracture at the end of the femoral prosthesis.  POSTOPERATIVE DIAGNOSIS:  Right periprosthetic femoral shaft fracture at the end of the femoral prosthesis.  PROCEDURE:  Open reduction internal fixation (ORIF), right proximal femur, shaft fracture was Zimmer cable plate, 187-mm 6-hole plate with 6 cables, 2 screws.  ANESTHESIA:  General.  EBL:  200-300 mL.  DRAINS:  None.  COMPLICATIONS:  None.  HISTORY:  A 58 year old female, who gave a history of losing weight from 500 pounds to 150 pounds approximately over a period of 3 years, was getting up from a chair, visiting family or friends here in [place] and was getting ready to fly back to [place] and she got up, she suffered a femur fracture at the tip of her press-fit prosthesis.  The patient states she had already had 3 surgical procedures, one primary and two revisions since her total hip arthroplasty in the early 90s. Last one was in 2010, which she states was for wear debris.  Fracture started 2 cm proximal to the stem and extended distally 60 cm distal.  DESCRIPTION OF PROCEDURE:  After induction of general anesthesia, the patient placed in lateral position with bean bag lateral positioning, standard prepping down the ankle with DuraPrep, split sheets, drapes, impervious stockinette, Betadine, and Steri-Drape.  Time-out procedure. Old incision had been marked with a skin marker prior to the Steri-Drape application.  Incision was made starting at the trochanter extending distally.  Incision was slightly posterior and it was curved anteriorly to be  at the lateral midline at the distal aspect.  Tensor fascia was split using the old opening the tensor fascia and then extended.  Vastus lateralis was released from the vastus tubercle.  Coagulation of arterial bleeder and then pulling the vastus lateralis anteriorly, coagulating some perforators that came through the intermuscular septum. Fracture was reduced with difficulty, knee flexion, distraction, self- retaining bone holding large clamps.  Once it was finally reduced, the long__________ clamp was applied and then two 20-gauge stainless steel wires were placed just to hold the reduction since if any of the clamps had been removed, the reduction would be lost.  This held in position. Self-retaining clamps were removed including the [place] claw clamp and the 2 large [place] clamps.  Cable plate system Zimmer was applied, 1 proximal cable followed by distal screws interfrag, tightened down the construct and then filling the rest of the cables, tightening them down maximally.  There was satisfactory position and alignment.  Hip prosthesis was stable.  No evidence of subsidence of the prosthesis. Irrigation with saline solution, then closure of vas lateralis, closure of tensor fascia with #1 interrupted Vicryl sutures, 2-0 Vicryl subcutaneous tissue, skin staple closure, postop dressing and transferred to recovery room. Instrument count and needle count was correct.  C-arm documentation of  reduction and position was obtained.     Heli Dino C. Lorin Mercy, M.D.     MCY/MEDQ  D:  09/30/2014  T:  09/30/2014  Job:  [id number]

## 2014-09-30 NOTE — Transfer of Care (Signed)
Immediate Anesthesia Transfer of Care Note  Patient: Diana Wheeler  Procedure(s) Performed: Procedure(s): ORIF OF PROXIMAL FEMUR FRACTURE WITH ZIMMER CABLE PLATE (Right)  Patient Location: PACU  Anesthesia Type:General  Level of Consciousness: awake  Airway & Oxygen Therapy: Patient Spontanous Breathing and Patient connected to nasal cannula oxygen  Post-op Assessment: Report given to PACU RN and Post -op Vital signs reviewed and stable  Post vital signs: Reviewed and stable  Complications: No apparent anesthesia complications

## 2014-09-30 NOTE — Progress Notes (Signed)
ANTICOAGULATION CONSULT NOTE - Initial Consult  Pharmacy Consult for Warfarin Indication: DVT prevention after ORIF and hx. of PE/DVT  No Known Allergies  Patient Measurements: Height: 5\' 3"  (160 cm) Weight: 145 lb (65.772 kg) IBW/kg (Calculated) : 52.4  Vital Signs: Temp: 97.9 F (36.6 C) (11/15 1036) Temp Source: Oral (11/15 0542) BP: 106/51 mmHg (11/15 1036) Pulse Rate: 85 (11/15 1036)  Labs:  Recent Labs  09/29/14 1447  HGB 11.5*  HCT 33.9*  PLT 227  LABPROT 15.4*  INR 1.21  CREATININE 0.56   Estimated Creatinine Clearance: 70.8 mL/min (by C-G formula based on Cr of 0.56).  Medical History: Past Medical History  Diagnosis Date  . COPD (chronic obstructive pulmonary disease)   . Cancer     cervical, ovarina, uterine, breast  . GERD (gastroesophageal reflux disease)    Medications:  Prescriptions prior to admission  Medication Sig Dispense Refill Last Dose  . albuterol (PROVENTIL HFA;VENTOLIN HFA) 108 (90 BASE) MCG/ACT inhaler Inhale 2 puffs into the lungs every 6 (six) hours as needed for wheezing or shortness of breath.   09/28/2014 at Unknown time  . clonazePAM (KLONOPIN) 0.5 MG tablet Take 0.5 mg by mouth daily.   0 09/27/2014 at Unknown time  . furosemide (LASIX) 40 MG tablet Take 40 mg by mouth daily as needed for fluid.   0 2 weeks  . SPIRIVA HANDIHALER 18 MCG inhalation capsule Place 18 mcg into inhaler and inhale daily.   3 09/26/2014  . zolpidem (AMBIEN) 5 MG tablet Take 5 mg by mouth at bedtime as needed for sleep.   0 09/26/2014  . potassium chloride SA (K-DUR,KLOR-CON) 20 MEQ tablet Take 20 mEq by mouth daily as needed (low potassium). Take with Lasix  0 2 weeks    Assessment: 58 yo female admitted with femur fracture requiring surgical intervention.  She has a PMH significant for cancer and per Dr. 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  She is at an increased risk for  re-occurrence and we have been asked to start her on Warfarin therapy.  She is currently receiving sq Lovenox 40mg  daily prophylaxis and we will continue this until INR is therapeutic.  She has a normal baseline PT/INR of 15.4 sec and 1.21 INR.  Her baseline CBC is 11.5/33.9 and a platelet of 227K.    Goal of Therapy:  INR 2-3 Monitor platelets by anticoagulation protocol: Yes   Plan:  Warfarin 5 mg PO x 1 Daily PT/INR and CBC weekly Warfarin teaching book and video for patient/family review F/u for questions prior to discharge.  Rober Minion, PharmD., MS Clinical Pharmacist Pager:  [phone] Thank you for allowing pharmacy to be part of this patients care team. 09/30/2014,10:49 AM

## 2014-09-30 NOTE — Anesthesia Postprocedure Evaluation (Signed)
  Anesthesia Post-op Note  Patient: Diana Wheeler  Procedure(s) Performed: Procedure(s): ORIF OF PROXIMAL FEMUR FRACTURE WITH ZIMMER CABLE PLATE (Right)  Patient Location: PACU  Anesthesia Type:General  Level of Consciousness: awake  Airway and Oxygen Therapy: Patient Spontanous Breathing  Post-op Pain: moderate  Post-op Assessment: Post-op Vital signs reviewed, Patient's Cardiovascular Status Stable, Respiratory Function Stable, Patent Airway, No signs of Nausea or vomiting and Pain level controlled  Post-op Vital Signs: Reviewed and stable  Last Vitals:  Filed Vitals:   09/30/14 1036  BP: 106/51  Pulse: 85  Temp: 36.6 C  Resp: 16    Complications: No apparent anesthesia complications

## 2014-09-30 NOTE — Interval H&P Note (Signed)
History and Physical Interval Note:  09/30/2014 5:57 AM  Diana Wheeler  has presented today for surgery, with the diagnosis of right femur fracture  The various methods of treatment have been discussed with the patient and family. After consideration of risks, benefits and other options for treatment, the patient has consented to  Procedure(s): ORIF OF PROXIMAL FEMUR FRACTURE WITH ZIMMER CABLE PLATE (Right) as a surgical intervention .  The patient's history has been reviewed, patient examined, no change in status, stable for surgery.  I have reviewed the patient's chart and labs.  Questions were answered to the patient's satisfaction.     Torianne Laflam C

## 2014-09-30 NOTE — Brief Op Note (Signed)
09/29/2014 - 09/30/2014  9:36 AM  PATIENT:  Diana Wheeler  58 y.o. female  PRE-OPERATIVE DIAGNOSIS:  right femur fracture  POST-OPERATIVE DIAGNOSIS:  right femur fracture  PROCEDURE:  Procedure(s): ORIF OF PROXIMAL FEMUR FRACTURE WITH ZIMMER CABLE PLATE (Right)  SURGEON:  Surgeon(s) and Role:    * Marybelle Killings, MD - Primary  PHYSICIAN ASSISTANT:   ASSISTANTS: RNFA , Angie  ANESTHESIA:   local and general  EBL:  Total I/O In: 2000 [I.V.:2000] Out: 925 [Urine:525; Blood:400]  BLOOD ADMINISTERED:none  DRAINS: none   LOCAL MEDICATIONS USED:  MARCAINE     SPECIMEN:  No Specimen  DISPOSITION OF SPECIMEN:  N/A  COUNTS:  YES  TOURNIQUET:  * No tourniquets in log *  DICTATION: .Other Dictation: Dictation Number [id number]  PLAN OF CARE: already inpt  PATIENT DISPOSITION:  PACU - hemodynamically stable.   Delay start of Pharmacological VTE agent (>24hrs) due to surgical blood loss or risk of bleeding: yes

## 2014-09-30 NOTE — Anesthesia Procedure Notes (Signed)
Procedure Name: Intubation Date/Time: 09/30/2014 7:19 AM Performed by: Marinda Elk A Pre-anesthesia Checklist: Patient identified, Timeout performed, Emergency Drugs available, Suction available and Patient being monitored Patient Re-evaluated:Patient Re-evaluated prior to inductionOxygen Delivery Method: Circle system utilized Preoxygenation: Pre-oxygenation with 100% oxygen Intubation Type: IV induction Ventilation: Mask ventilation without difficulty Laryngoscope Size: Mac and 3 Grade View: Grade I Tube type: Oral Tube size: 7.5 mm Number of attempts: 1 Airway Equipment and Method: Stylet Placement Confirmation: ETT inserted through vocal cords under direct vision,  positive ETCO2 and breath sounds checked- equal and bilateral Secured at: 21 cm Tube secured with: Tape Dental Injury: Teeth and Oropharynx as per pre-operative assessment

## 2014-09-30 NOTE — Progress Notes (Signed)
Orthopedic Tech Progress Note Patient Details:  Diana Wheeler [birth year] [id number]  Ortho Devices Ortho Device/Splint Location: trapeze bar patient helper Ortho Device/Splint Interventions: Application   Diana Wheeler 09/30/2014, 11:00 AM

## 2014-09-30 NOTE — Anesthesia Preprocedure Evaluation (Addendum)
Anesthesia Evaluation  Patient identified by MRN, date of birth, ID band Patient awake    Reviewed: Allergy & Precautions, H&P , NPO status , Patient's Chart, lab work & pertinent test results  Airway Mallampati: I   Neck ROM: full    Dental  (+) Edentulous Lower, Edentulous Upper   Pulmonary COPDCurrent Smoker,          Cardiovascular negative cardio ROS      Neuro/Psych    GI/Hepatic GERD-  ,  Endo/Other    Renal/GU      Musculoskeletal   Abdominal   Peds  Hematology   Anesthesia Other Findings   Reproductive/Obstetrics                            Anesthesia Physical Anesthesia Plan  ASA: II  Anesthesia Plan: General   Post-op Pain Management:    Induction: Intravenous  Airway Management Planned: Oral ETT  Additional Equipment:   Intra-op Plan:   Post-operative Plan: Extubation in OR  Informed Consent: I have reviewed the patients History and Physical, chart, labs and discussed the procedure including the risks, benefits and alternatives for the proposed anesthesia with the patient or authorized representative who has indicated his/her understanding and acceptance.     Plan Discussed with: CRNA, Anesthesiologist and Surgeon  Anesthesia Plan Comments:         Anesthesia Quick Evaluation

## 2014-10-01 LAB — CBC
HCT: 27.4 % — ABNORMAL LOW (ref 36.0–46.0)
Hemoglobin: 9.2 g/dL — ABNORMAL LOW (ref 12.0–15.0)
MCH: 33.6 pg (ref 26.0–34.0)
MCHC: 33.6 g/dL (ref 30.0–36.0)
MCV: 100 fL (ref 78.0–100.0)
Platelets: 181 10*3/uL (ref 150–400)
RBC: 2.74 MIL/uL — ABNORMAL LOW (ref 3.87–5.11)
RDW: 13.2 % (ref 11.5–15.5)
WBC: 8.8 10*3/uL (ref 4.0–10.5)

## 2014-10-01 LAB — BASIC METABOLIC PANEL
Anion gap: 8 (ref 5–15)
BUN: 5 mg/dL — AB (ref 6–23)
CHLORIDE: 104 meq/L (ref 96–112)
CO2: 28 mEq/L (ref 19–32)
Calcium: 8 mg/dL — ABNORMAL LOW (ref 8.4–10.5)
Creatinine, Ser: 0.58 mg/dL (ref 0.50–1.10)
GFR calc Af Amer: >90 mL/min (ref >90)
GLUCOSE: 102 mg/dL — AB (ref 70–99)
POTASSIUM: 4.2 meq/L (ref 3.7–5.3)
SODIUM: 140 meq/L (ref 137–147)

## 2014-10-01 LAB — PROTIME-INR
INR: 1.2 (ref 0.00–1.49)
PROTHROMBIN TIME: 15.3 s — AB (ref 11.6–15.2)

## 2014-10-01 LAB — VITAMIN D 25 HYDROXY (VIT D DEFICIENCY, FRACTURES): Vit D, 25-Hydroxy: 31 ng/mL (ref 30–100)

## 2014-10-01 MED ORDER — WARFARIN SODIUM 5 MG PO TABS
5.0000 mg | ORAL_TABLET | Freq: Once | ORAL | Status: AC
  Administered 2014-10-01: 5 mg via ORAL
  Filled 2014-10-01 (×2): qty 1

## 2014-10-01 MED ORDER — BOOST PLUS PO LIQD
237.0000 mL | Freq: Two times a day (BID) | ORAL | Status: DC
  Administered 2014-10-02 – 2014-10-05 (×2): 237 mL via ORAL
  Filled 2014-10-01 (×11): qty 237

## 2014-10-01 NOTE — Progress Notes (Signed)
Subjective: 1 Day Post-Op Procedure(s) (LRB): ORIF OF PROXIMAL FEMUR FRACTURE WITH ZIMMER CABLE PLATE (Right) Patient reports pain as severe.    Pt reports pain 9.5 but she is watching TV, had some brfeakfaxtSmiling , asking about surgery. . Not accurate reporting of pain by patient.   Objective: Vital signs in last 24 hours: Temp:  [97.5 F (36.4 C)-98.6 F (37 C)] 98.6 F (37 C) (11/16 0552) Pulse Rate:  [73-85] 76 (11/16 0552) Resp:  [13-20] 18 (11/16 0552) BP: (85-122)/(43-91) 94/49 mmHg (11/16 0552) SpO2:  [93 %-100 %] 98 % (11/16 0552)  Intake/Output from previous day: 11/15 0701 - 11/16 0700 In: 3530 [P.O.:480; I.V.:3000; IV Piggyback:50] Out: 3275 [Urine:2875; Blood:400] Intake/Output this shift:     Recent Labs  09/29/14 1447 09/30/14 0804 09/30/14 0918 10/01/14 0510  HGB 11.5* 10.2* 10.5* 9.2*    Recent Labs  09/29/14 1447  09/30/14 0918 10/01/14 0510  WBC 11.9*  --   --  8.8  RBC 3.47*  --   --  2.74*  HCT 33.9*  < > 31.0* 27.4*  PLT 227  --   --  181  < > = values in this interval not displayed.  Recent Labs  09/29/14 1447  09/30/14 0918 10/01/14 0510  NA 142  < > 139 140  K 3.8  < > 3.7 4.2  CL 107  --   --  104  CO2 23  --   --  28  BUN 14  --   --  5*  CREATININE 0.56  --   --  0.58  GLUCOSE 109*  < > 113* 102*  CALCIUM 8.3*  --   --  8.0*  < > = values in this interval not displayed.  Recent Labs  09/29/14 1447 10/01/14 0510  INR 1.21 1.20    Neurologically intact  Assessment/Plan: 1 Day Post-Op Procedure(s) (LRB): ORIF OF PROXIMAL FEMUR FRACTURE WITH ZIMMER CABLE PLATE (Right) Up with therapy    Jemari Hallum C 10/01/2014, 8:32 AM

## 2014-10-01 NOTE — Progress Notes (Signed)
ANTICOAGULATION CONSULT NOTE   Pharmacy Consult for Warfarin Indication: DVT prevention after ORIF and hx. of PE/DVT  No Known Allergies  Labs:  Recent Labs  09/29/14 1447 09/30/14 0804 09/30/14 0918 10/01/14 0510  HGB 11.5* 10.2* 10.5* 9.2*  HCT 33.9* 30.0* 31.0* 27.4*  PLT 227  --   --  181  LABPROT 15.4*  --   --  15.3*  INR 1.21  --   --  1.20  CREATININE 0.56  --   --  0.58   Estimated Creatinine Clearance: 70.8 mL/min (by C-G formula based on Cr of 0.58).   Assessment: 58 yo female admitted with femur fracture requiring surgical intervention.  She has a PMH significant for cancer and per Dr. 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  She is at an increased risk for re-occurrence and we have been asked to start her on Warfarin therapy.  She is currently receiving sq Lovenox 40mg  daily prophylaxis and we will continue this until INR is therapeutic.    INR = 1.2 today  Goal of Therapy:  INR 2-3 Monitor platelets by anticoagulation protocol: Yes   Plan:  Repeat Warfarin 5 mg PO x 1 Daily PT/INR and CBC weekly Warfarin teaching book and video for patient/family review F/u for questions prior to discharge.  Thank you. Anette Guarneri, PharmD [phone]  10/01/2014,1:03 PM

## 2014-10-01 NOTE — Progress Notes (Signed)
Utilization review completed.  

## 2014-10-01 NOTE — Evaluation (Signed)
Physical Therapy Evaluation Patient Details Name: Diana Wheeler MRN: [id number] DOB: 06/16/1956 Today's Date: 10/01/2014   History of Present Illness  Right periprosthetic femoral shaft fracture at end of femoral prosthesis.    Clinical Impression  Pt admitted with above and is now s/p R ORIF with PWB orders. Pt currently with functional limitations due to the deficits listed below (see PT Problem List). Pt will benefit from skilled PT to increase their independence and safety with mobility to allow discharge to the venue listed below. Pt lives in [place] and will stay in [place] for ~2.5 weeks with sister and then plans on flying back to [facility].  Pt will need RW, 3-1 BSC, and HHPT.     Follow Up Recommendations Home health PT    Equipment Recommendations  Rolling walker with 5" wheels;3in1 (PT)    Recommendations for Other Services       Precautions / Restrictions Precautions Precautions: Fall Restrictions Weight Bearing Restrictions: Yes RLE Weight Bearing: Partial weight bearing RLE Partial Weight Bearing Percentage or Pounds: 50 %      Mobility  Bed Mobility Overal bed mobility: Needs Assistance;+2 for physical assistance Bed Mobility: Supine to Sit     Supine to sit: Mod assist     General bed mobility comments: Pt had difficulty getting R hip positioned while at EOB.  Transfers Overall transfer level: Needs assistance Equipment used: Rolling walker (2 wheeled) Transfers: Stand Pivot Transfers   Stand pivot transfers: Mod assist       General transfer comment: Pt transferred from bed > BSC> recliner with use of RW.  Pt adhering to PWB status on R LE  Ambulation/Gait Ambulation/Gait assistance: Mod assist Ambulation Distance (Feet): 1 Feet Assistive device: Rolling walker (2 wheeled)       General Gait Details: SPT with RW   Stairs            Wheelchair Mobility    Modified Rankin (Stroke Patients Only)       Balance Overall balance  assessment: Needs assistance   Sitting balance-Leahy Scale: Fair Sitting balance - Comments: Pt very guarded due to pain.   Standing balance support: Bilateral upper extremity supported Standing balance-Leahy Scale: Poor                               Pertinent Vitals/Pain Pain Assessment: 0-10 Pain Score: 9  Pain Location: R hip Pain Intervention(s): Premedicated before session;Ice applied;Monitored during session;Limited activity within patient's tolerance;Patient requesting pain meds-RN notified    Home Living Family/patient expects to be discharged to:: Private residence Living Arrangements: Other relatives Available Help at Discharge: Family Type of Home: House Home Access: Stairs to enter   [employer] of Steps: 1 + 1 Home Layout: One level [place]: None Additional Comments: Pt lives in [place] and will go to [facility] in [place] but will be flying back to [facility] Dec 3.    Prior Function Level of Independence: Independent               Hand Dominance   Dominant Hand: Right    Extremity/Trunk Assessment   Upper Extremity Assessment: Defer to OT evaluation           Lower Extremity Assessment: RLE deficits/detail RLE Deficits / Details: limited range of R LE due to pain       Communication   Communication: No difficulties  Cognition Arousal/Alertness: Awake/alert Behavior During Therapy: WFL for tasks assessed/performed  Overall Cognitive Status: Within Functional Limits for tasks assessed                      General Comments General comments (skin integrity, edema, etc.): Pt pre-occupied with stress of having the finalization of her divorce tomorrow in [place], and trying to get everything situated with that. Cooperative though.    Exercises General Exercises - Lower Extremity Ankle Circles/Pumps: AROM;5 reps Heel Slides: AAROM;Right;5 reps      Assessment/Plan    PT Assessment Patient needs  continued PT services  PT Diagnosis Difficulty walking;Acute pain   PT Problem List Decreased mobility;Decreased balance;Decreased activity tolerance;Decreased range of motion;Decreased strength;Pain  PT Treatment Interventions Gait training;Functional mobility training;Therapeutic activities;Therapeutic exercise;Balance training;Patient/family education;Stair training   PT Goals (Current goals can be found in the Care Plan section) Acute Rehab PT Goals PT Goal Formulation: With patient Time For Goal Achievement: 10/08/14 Potential to Achieve Goals: Good    Frequency Min 5X/week   Barriers to discharge        Co-evaluation               End of Session Equipment Utilized During Treatment: Gait belt Activity Tolerance: Patient limited by pain Patient left: in chair;with call bell/phone within reach Nurse Communication: Patient requests pain meds (pt calling nurse upon exit)         Time: 2010-0712 PT Time Calculation (min) (ACUTE ONLY): 30 min   Charges:   PT Evaluation $Initial PT Evaluation Tier I: 1 Procedure PT Treatments $Therapeutic Activity: 8-22 mins   PT G Codes:          Westley Blass LUBECK 10/01/2014, 11:14 AM

## 2014-10-01 NOTE — Progress Notes (Signed)
INITIAL NUTRITION ASSESSMENT  DOCUMENTATION CODES Per approved criteria  -Not Applicable   INTERVENTION: Provide Boost Plus po BID, each supplements provides 360 kcal and 14 grams of protein.   Encourage adequate PO intake.  NUTRITION DIAGNOSIS: Inadequate oral intake related to abdominal discomfort and  dislike of food as evidenced by meal completion of 25-50%.   Goal: Pt to meet >/= 90% of their estimated nutrition needs   Monitor:  PO intake, weight trends, labs, I/O's  Reason for Assessment: MD consult  58 y.o. female  Admitting Dx: Femur fracture  ASSESSMENT: Pt with fall with right periprothetic femur fracture.She states she used to weigh 527 lbs and gradually lost over 3 yrs down to 145 lbs.[address] [address] getting up to go to bathroom. Femur cracked.   Procedure(11/16): ORIF OF PROXIMAL FEMUR FRACTURE WITH ZIMMER CABLE PLATE (Right)  Meal completion is 25-50%. Pt reports her appetite has been improving. She reports PTA she would eat fine with at least 2 full meals a day a long with a nutrition shake and protein bars between meals. Pt reports no recent weight loss with his usual body weight of 140-145 lbs. Pt does reports in the past she had weighed 527 lbs, however it was over 30 years ago ans she has made some life changes, such as diet changes and being more active. Pt is agreeable on Boost Plus as she drinks nutritional shakes at home often. Will order. Pt was educated to continue nutritional shakes at home especially when appetite is decreased. Pt was encouraged to eat her food at meals and drink her supplements to help with healing.   Pt with no observed significant fat or muscle mass loss.  Labs: Low BUN and calcium.  Height: Ht Readings from Last 1 Encounters:  09/29/14 5\' 3"  (1.6 m)    Weight: Wt Readings from Last 1 Encounters:  09/29/14 145 lb (65.772 kg)    Ideal Body Weight: 115 lbs  % Ideal Body Weight: 126%  Wt Readings from Last 10 Encounters:   09/29/14 145 lb (65.772 kg)    Usual Body Weight: 140-145 lbs  % Usual Body Weight: 100%  BMI:  Body mass index is 25.69 kg/(m^2).  Estimated Nutritional Needs: Kcal: 1850-2050 Protein: 80-90 grams Fluid: 1.85- 2.05 L/day  Skin: Incision right hip  Diet Order: Diet regular  EDUCATION NEEDS: -Education needs addressed   Intake/Output Summary (Last 24 hours) at 10/01/14 0926 Last data filed at 10/01/14 0857  Gross per 24 hour  Intake   1530 ml  Output   2400 ml  Net   -870 ml    Last BM: 11/13  Labs:   Recent Labs Lab 09/29/14 1447 09/30/14 0804 09/30/14 0918 10/01/14 0510  NA 142 138 139 140  K 3.8 4.1 3.7 4.2  CL 107  --   --  104  CO2 23  --   --  28  BUN 14  --   --  5*  CREATININE 0.56  --   --  0.58  CALCIUM 8.3*  --   --  8.0*  GLUCOSE 109* 112* 113* 102*    CBG (last 3)  No results for input(s): GLUCAP in the last 72 hours.  Scheduled Meds: . clonazePAM  0.5 mg Oral Daily  . docusate sodium  100 mg Oral BID  . enoxaparin (LOVENOX) injection  40 mg Subcutaneous Q24H  . tiotropium  18 mcg Inhalation Daily  . warfarin   Does not apply Once  . Warfarin - Pharmacist  Dosing Inpatient   Does not apply q1800    Continuous Infusions: . sodium chloride 100 mL/hr at 09/30/14 2312  . sodium chloride Stopped (09/29/14 1623)  . sodium chloride 75 mL/hr at 09/29/14 1624    Past Medical History  Diagnosis Date  . COPD (chronic obstructive pulmonary disease)   . Cancer     cervical, ovarina, uterine, breast  . GERD (gastroesophageal reflux disease)     Past Surgical History  Procedure Laterality Date  . Breast surgery    . Abdominal hysterectomy    . Appendectomy    . Partial hip arthroplasty      reports bilat x3    Kallie Locks, MS, RD, LDN Pager # [phone] After hours/ weekend pager # [phone]

## 2014-10-01 NOTE — Evaluation (Signed)
Occupational Therapy Evaluation Patient Details Name: Diana Wheeler MRN: [id number] DOB: 06/23/1956 Today's Date: 10/01/2014    History of Present Illness Right periprosthetic femoral shaft fracture at end of femoral prosthesis.  R LE PWB.   Clinical Impression   Pt is a 58 y/o female from [facility], whom was planning to fly home yesterday when she sustained the above mentioned fracture. She presents with decreased ability to perform ADL and functional transfers related to transfers. She plans to d/c home to her sisters house prior to [place] home 10/18/14. Pain is currently a limiting factor & she will benefit from acute OT to address deficits (see problem list below).     Follow Up Recommendations  Home health OT;Supervision/Assistance - 24 hour    Equipment Recommendations  3 in 1 bedside comode;Other (comment) (Assess for Tub bench needs )    Recommendations for Other Services       Precautions / Restrictions Precautions Precautions: Fall Restrictions Weight Bearing Restrictions: Yes RLE Weight Bearing: Partial weight bearing RLE Partial Weight Bearing Percentage or Pounds: 50 %      Mobility Bed Mobility Overal bed mobility:  (Pt up in chair upon OT arrival) Bed Mobility: Supine to Sit     Supine to sit: Mod assist     General bed mobility comments: Pt had difficulty getting R hip positioned while at EOB.  Transfers Overall transfer level: Needs assistance Equipment used: Rolling walker (2 wheeled) Transfers: Sit to/from [facility] Sit to Stand: Mod assist Stand pivot transfers: Mod assist       General transfer comment: Pt transferred from chair to standing then SPT to 3:1 beside chair w/ Mod A and vc's for safety/sequencing w/ RW and adherence to [facility] RLE.    Balance Overall balance assessment: Needs assistance Sitting-balance support: Bilateral upper extremity supported Sitting balance-Leahy Scale: Fair Sitting balance - Comments: Pt very  guarded due to pain.   Standing balance support: Bilateral upper extremity supported Standing balance-Leahy Scale: Poor                              ADL Overall ADL's : Needs assistance/impaired     Grooming: Set up;Sitting   Upper Body Bathing: Set up;Sitting   Lower Body Bathing: Set up;Minimal assistance;Sit to/from stand       Lower Body Dressing: Set up;Moderate assistance;Sit to/from stand   Toilet Transfer: Moderate assistance;BSC;Stand-pivot;RW;Cueing for sequencing;Cueing for safety   Toileting- Clothing Manipulation and Hygiene: Min guard;Sit to/from stand   Tub/ Banker:  (TBA)   Functional mobility during ADLs: Moderate assistance;Cueing for sequencing;Rolling walker;Cueing for safety (VC's for PWB RLE and to use arms and RW) General ADL Comments: Pt was educated in role of OT and after assessment was agreeable to ADL retraining for toilet transfer and functional mobility. Pt w/ increased pain limiting treatment session, therefore RN gave meds and pt performed SPT to 3:1 w/ vc's for safety and sequencing. She plans to d/c to sisters house after hospital stay until she can return home to [facility]. Pt states 10/18/14, she will fly home.     Vision  Wears glasses PRN for reading. No changes in vision per her report.                   Perception     Praxis      Pertinent Vitals/Pain Pain Assessment: 0-10 Pain Score: 10-Worst pain ever Pain Location: R hip Pain Descriptors /  Indicators: Sore Pain Intervention(s): Limited activity within patient's tolerance;RN gave pain meds during session;Monitored during session;Repositioned;Ice applied     Hand Dominance Right   Extremity/Trunk Assessment Upper Extremity Assessment Upper Extremity Assessment: Overall WFL for tasks assessed   Lower Extremity Assessment Lower Extremity Assessment: Defer to PT evaluation RLE Deficits / Details: limited range of R LE due to pain RLE: Unable to fully  assess due to pain       Communication Communication Communication: No difficulties   Cognition Arousal/Alertness: Awake/alert Behavior During Therapy: WFL for tasks assessed/performed Overall Cognitive Status: Within Functional Limits for tasks assessed                     General Comments       Exercises       Shoulder Instructions      Home Living Family/patient expects to be discharged to:: Private residence Living Arrangements: Other relatives Available Help at Discharge: Family Type of Home: House Home Access: Stairs to enter Technical brewer of Steps: 1 + 1   Home Layout: One level     Bathroom Shower/Tub: Teacher, early years/pre: Standard     Home Equipment: None   Additional Comments: Pt lives in [place] and will go to sister's house in [place] but will be flying back to [facility] Dec 3.      Prior Functioning/Environment Level of Independence: Independent             OT Diagnosis: Acute pain   OT Problem List: Decreased strength;Decreased activity tolerance;Impaired balance (sitting and/or standing);Decreased knowledge of precautions;Decreased knowledge of use of DME or AE;Pain   OT Treatment/Interventions: Self-care/ADL training;DME and/or AE instruction;Patient/family education;Therapeutic activities;Therapeutic exercise;Balance training    OT Goals(Current goals can be found in the care plan section) Acute Rehab OT Goals Patient Stated Goal: Go home to [facility]  ASAP Time For Goal Achievement: 10/15/14 Potential to Achieve Goals: Good ADL Goals Pt Will Perform Grooming: with set-up;sitting Pt Will Perform Lower Body Dressing: with set-up;with supervision;with adaptive equipment;sit to/from stand Pt Will Transfer to Toilet: with supervision;ambulating;bedside commode Pt Will Perform Toileting - Clothing Manipulation and hygiene: with supervision;sit to/from stand Pt Will Perform Tub/Shower Transfer: with min assist;rolling  walker;ambulating;Stand pivot transfer (tub bench vs shower seat vs sponge bathing)  OT Frequency: Min 2X/week   Barriers to D/C: Other (comment) (Pt from out of town, was supposed to fly back home to [facility] last night)          Co-evaluation              End of Session Equipment Utilized During Treatment: Gait belt;Rolling walker Nurse Communication: Other (comment) (RN gave additional pain medication during session today.)  Activity Tolerance: Patient limited by pain Patient left: in chair;with call bell/phone within reach   Time: 8413-2440 OT Time Calculation (min): 26 min Charges:  OT General Charges $OT Visit: 1 Procedure OT Evaluation $Initial OT Evaluation Tier I: 1 Procedure OT Treatments $Self Care/Home Management : 8-22 mins (10 min) G-Codes:    [place], Amy Beth [employer], Amy Felsenthal, [place] 10/01/2014, 11:28 AM

## 2014-10-02 ENCOUNTER — Encounter (HOSPITAL_COMMUNITY): Payer: Self-pay | Admitting: Orthopaedic Surgery

## 2014-10-02 LAB — CBC
HEMATOCRIT: 27.2 % — AB (ref 36.0–46.0)
Hemoglobin: 8.9 g/dL — ABNORMAL LOW (ref 12.0–15.0)
MCH: 32.1 pg (ref 26.0–34.0)
MCHC: 32.7 g/dL (ref 30.0–36.0)
MCV: 98.2 fL (ref 78.0–100.0)
Platelets: 195 10*3/uL (ref 150–400)
RBC: 2.77 MIL/uL — ABNORMAL LOW (ref 3.87–5.11)
RDW: 12.9 % (ref 11.5–15.5)
WBC: 7.7 10*3/uL (ref 4.0–10.5)

## 2014-10-02 LAB — PROTIME-INR
INR: 1.78 — AB (ref 0.00–1.49)
PROTHROMBIN TIME: 20.8 s — AB (ref 11.6–15.2)

## 2014-10-02 LAB — BASIC METABOLIC PANEL
Anion gap: 9 (ref 5–15)
BUN: 5 mg/dL — AB (ref 6–23)
CHLORIDE: 100 meq/L (ref 96–112)
CO2: 29 mEq/L (ref 19–32)
Calcium: 8.1 mg/dL — ABNORMAL LOW (ref 8.4–10.5)
Creatinine, Ser: 0.56 mg/dL (ref 0.50–1.10)
GFR calc Af Amer: >90 mL/min (ref >90)
GLUCOSE: 91 mg/dL (ref 70–99)
POTASSIUM: 4 meq/L (ref 3.7–5.3)
SODIUM: 138 meq/L (ref 137–147)

## 2014-10-02 MED ORDER — WARFARIN SODIUM 3 MG PO TABS
3.0000 mg | ORAL_TABLET | Freq: Once | ORAL | Status: AC
  Administered 2014-10-02: 3 mg via ORAL
  Filled 2014-10-02: qty 1

## 2014-10-02 NOTE — Progress Notes (Addendum)
Subjective: 2 Days Post-Op Procedure(s) (LRB): ORIF OF PROXIMAL FEMUR FRACTURE WITH ZIMMER CABLE PLATE (Right) Patient reports pain as moderate.  Had bloody drainage when she turned over .  Hx DVT/PE . On Lovenox starting coumadin  Objective: Vital signs in last 24 hours: Temp:  [99.1 F (37.3 C)-99.5 F (37.5 C)] 99.3 F (37.4 C) (11/17 0521) Pulse Rate:  [77-85] 85 (11/17 0521) Resp:  [16-18] 18 (11/17 0521) BP: (86-92)/(45-52) 92/51 mmHg (11/17 0521) SpO2:  [95 %-100 %] 95 % (11/17 0521)  Intake/Output from previous day: 11/16 0701 - 11/17 0700 In: 240 [P.O.:240] Out: 950 [Urine:950] Intake/Output this shift:     Recent Labs  09/29/14 1447 09/30/14 0804 09/30/14 0918 10/01/14 0510 10/02/14 0518  HGB 11.5* 10.2* 10.5* 9.2* 8.9*    Recent Labs  10/01/14 0510 10/02/14 0518  WBC 8.8 7.7  RBC 2.74* 2.77*  HCT 27.4* 27.2*  PLT 181 195    Recent Labs  10/01/14 0510 10/02/14 0518  NA 140 138  K 4.2 4.0  CL 104 100  CO2 28 29  BUN 5* 5*  CREATININE 0.58 0.56  GLUCOSE 102* 91  CALCIUM 8.0* 8.1*    Recent Labs  10/01/14 0510 10/02/14 0518  INR 1.20 1.78*    Neurologically intact  Assessment/Plan: 2 Days Post-Op Procedure(s) (LRB): ORIF OF PROXIMAL FEMUR FRACTURE WITH ZIMMER CABLE PLATE (Right) Up with therapy.   Hgb stable.   Zeanna Sunde C 10/02/2014, 8:13 AM

## 2014-10-02 NOTE — Progress Notes (Signed)
Physical Therapy Treatment Patient Details Name: Diana Wheeler MRN: [id number] DOB: May 08, 1956 Today's Date: 10/02/2014    History of Present Illness Right periprosthetic femoral shaft fracture at end of femoral prosthesis.      PT Comments    Pt able to ambulate in hallway today with RW with 10/10 pain R hip/thigh.  Follow Up Recommendations  Home health PT     Equipment Recommendations  Rolling walker with 5" wheels;3in1 (PT)    Recommendations for Other Services       Precautions / Restrictions Precautions Precautions: Fall Restrictions Weight Bearing Restrictions: Yes RLE Weight Bearing: Partial weight bearing RLE Partial Weight Bearing Percentage or Pounds: 50 %    Mobility  Bed Mobility Overal bed mobility: Needs Assistance Bed Mobility: Sit to Supine       Sit to supine: Min assist (for R LE)   General bed mobility comments: A to get R LE up on bed.  Transfers Overall transfer level: Needs assistance Equipment used: Rolling walker (2 wheeled) Transfers: Sit to/from Stand Sit to Stand: Min assist;Mod assist         General transfer comment: MIN A from recliner and MOD A for BSC. cues for hand placement.  Ambulation/Gait Ambulation/Gait assistance: Min guard Ambulation Distance (Feet): 80 Feet Assistive device: Rolling walker (2 wheeled) Gait Pattern/deviations: Step-to pattern;Antalgic;Decreased stance time - right Gait velocity: decreased and guarded   General Gait Details: Pt able to adhere to PWB on R LE.   Stairs            Wheelchair Mobility    Modified Rankin (Stroke Patients Only)       Balance Overall balance assessment: Needs assistance         Standing balance support: Bilateral upper extremity supported Standing balance-Leahy Scale: Poor                      Cognition Arousal/Alertness: Awake/alert Behavior During Therapy: WFL for tasks assessed/performed Overall Cognitive Status: Within Functional  Limits for tasks assessed                      Exercises General Exercises - Lower Extremity Ankle Circles/Pumps: AROM;10 reps;Supine Quad Sets: 10 reps;Both;Supine Gluteal Sets: Strengthening;5 reps;Supine Heel Slides: AAROM;Right;10 reps Hip ABduction/ADduction: AAROM;Right;10 reps;Supine    General Comments        Pertinent Vitals/Pain Pain Assessment: 0-10 Pain Score: 10-Worst pain ever Pain Location: R hip/thigh Pain Descriptors / Indicators: Sore Pain Intervention(s): Premedicated before session;Monitored during session;Repositioned    Home Living                      Prior Function            PT Goals (current goals can now be found in the care plan section) Acute Rehab PT Goals Patient Stated Goal: Go home to [facility]  ASAP PT Goal Formulation: With patient Time For Goal Achievement: 10/08/14 Potential to Achieve Goals: Good Progress towards PT goals: Progressing toward goals    Frequency  Min 5X/week    PT Plan Current plan remains appropriate    Co-evaluation             End of Session Equipment Utilized During Treatment: Gait belt Activity Tolerance: Patient tolerated treatment well;Patient limited by pain Patient left: in bed;with call bell/phone within reach     Time: 1024-1056 PT Time Calculation (min) (ACUTE ONLY): 32 min  Charges:  $Gait Training: 8-22 mins $Therapeutic  Exercise: 8-22 mins                    G Codes:      Diana Wheeler 10/02/2014, 11:14 AM

## 2014-10-02 NOTE — Plan of Care (Signed)
Problem: Consults Goal: General Surgical Patient Education (See Patient Education module for education specifics) Outcome: Completed/Met Date Met:  10/02/14 Goal: Skin Care Protocol Initiated - if Braden Score 18 or less If consults are not indicated, leave blank or document N/A Outcome: Completed/Met Date Met:  10/02/14 Goal: Nutrition Consult-if indicated Outcome: Not Applicable Date Met:  09/62/83 Goal: Diabetes Guidelines if Diabetic/Glucose > 140 If diabetic or lab glucose is > 140 mg/dl - Initiate Diabetes/Hyperglycemia Guidelines & Document Interventions  Outcome: Not Applicable Date Met:  66/29/47  Problem: Phase I Progression Outcomes Goal: Pain controlled with appropriate interventions Outcome: Completed/Met Date Met:  10/02/14 Goal: OOB as tolerated unless otherwise ordered Outcome: Completed/Met Date Met:  10/02/14 Goal: Incision/dressings dry and intact Outcome: Completed/Met Date Met:  10/02/14 Goal: Sutures/staples intact Outcome: Completed/Met Date Met:  10/02/14 Goal: Tubes/drains patent Outcome: Not Applicable Date Met:  65/46/50 Goal: Initial discharge plan identified Outcome: Completed/Met Date Met:  10/02/14 Goal: Voiding-avoid urinary catheter unless indicated Outcome: Completed/Met Date Met:  10/02/14 Goal: Vital signs/hemodynamically stable Outcome: Completed/Met Date Met:  10/02/14 Goal: Other Phase I Outcomes/Goals Outcome: Not Applicable Date Met:  35/46/56

## 2014-10-02 NOTE — Progress Notes (Signed)
ANTICOAGULATION CONSULT NOTE   Pharmacy Consult for Warfarin Indication: DVT prevention after ORIF and hx. of PE/DVT  No Known Allergies  Labs:  Recent Labs  09/29/14 1447  09/30/14 0918 10/01/14 0510 10/02/14 0518  HGB 11.5*  < > 10.5* 9.2* 8.9*  HCT 33.9*  < > 31.0* 27.4* 27.2*  PLT 227  --   --  181 195  LABPROT 15.4*  --   --  15.3* 20.8*  INR 1.21  --   --  1.20 1.78*  CREATININE 0.56  --   --  0.58 0.56  < > = values in this interval not displayed. Estimated Creatinine Clearance: 70.8 mL/min (by C-G formula based on Cr of 0.56).   Assessment: 58 yo female admitted with femur fracture requiring surgical intervention.  She has a PMH significant for cancer and per Dr. 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  She is at an increased risk for re-occurrence and we have been asked to start her on Warfarin therapy.  She is currently receiving sq Lovenox 40mg  daily prophylaxis and we will continue this until INR is therapeutic.    INR = 1.78 today  Goal of Therapy:  INR 2-3 Monitor platelets by anticoagulation protocol: Yes   Plan:  Warfarin 3 mg PO x 1 Daily PT/INR and CBC weekly Warfarin teaching book and video for patient/family review F/u for questions prior to discharge.  Thank you. Anette Guarneri, PharmD [phone]  10/02/2014,11:47 AM

## 2014-10-02 NOTE — Plan of Care (Signed)
Problem: Consults Goal: Hip/Femur Fracture Patient Education See Patient Education Module for education specifics.  Outcome: Progressing Goal: Skin Care Protocol Initiated - if Braden Score 18 or less If consults are not indicated, leave blank or document N/A  Outcome: Progressing Goal: Nutrition Consult-if indicated Outcome: Progressing Goal: Diabetes Guidelines if Diabetic/Glucose > 140 If diabetic or lab glucose is > 140 mg/dl - Initiate Diabetes/Hyperglycemia Guidelines & Document Interventions  Outcome: Progressing  Problem: Phase I Progression Outcomes Goal: Pre op pain controlled with appropriate interventions Outcome: Progressing Goal: Pre op Medical MD consult, if indicated Outcome: Progressing Goal: Pre op labs/procedures/consults per MD order Outcome: Progressing Goal: Pre op Protime within normal limits Outcome: Progressing Goal: Pre op NPO per MD orders Outcome: Progressing Goal: Pre op-initial discharge plan identified Outcome: Progressing Goal: Pre op hemodynamically stable Outcome: Progressing

## 2014-10-03 LAB — TYPE AND SCREEN
ABO/RH(D): B POS
Antibody Screen: NEGATIVE
Unit division: 0
Unit division: 0

## 2014-10-03 LAB — CBC
HEMATOCRIT: 30.2 % — AB (ref 36.0–46.0)
Hemoglobin: 9.7 g/dL — ABNORMAL LOW (ref 12.0–15.0)
MCH: 31.8 pg (ref 26.0–34.0)
MCHC: 32.1 g/dL (ref 30.0–36.0)
MCV: 99 fL (ref 78.0–100.0)
Platelets: 231 10*3/uL (ref 150–400)
RBC: 3.05 MIL/uL — ABNORMAL LOW (ref 3.87–5.11)
RDW: 13 % (ref 11.5–15.5)
WBC: 6.6 10*3/uL (ref 4.0–10.5)

## 2014-10-03 LAB — PROTIME-INR
INR: 2.25 — ABNORMAL HIGH (ref 0.00–1.49)
Prothrombin Time: 25 seconds — ABNORMAL HIGH (ref 11.6–15.2)

## 2014-10-03 MED ORDER — BISACODYL 10 MG RE SUPP
10.0000 mg | Freq: Once | RECTAL | Status: AC
  Administered 2014-10-03: 10 mg via RECTAL
  Filled 2014-10-03: qty 1

## 2014-10-03 MED ORDER — WARFARIN SODIUM 3 MG PO TABS
3.0000 mg | ORAL_TABLET | Freq: Once | ORAL | Status: AC
  Administered 2014-10-03: 3 mg via ORAL
  Filled 2014-10-03: qty 1

## 2014-10-03 NOTE — Progress Notes (Signed)
Subjective: 3 Days Post-Op Procedure(s) (LRB): ORIF OF PROXIMAL FEMUR FRACTURE WITH ZIMMER CABLE PLATE (Right) Patient reports pain as moderate.   C/O constipation which is chronic but no BM in several day.    Objective: Vital signs in last 24 hours: Temp:  [98.2 F (36.8 C)-99.3 F (37.4 C)] 98.2 F (36.8 C) (11/18 0609) Pulse Rate:  [81-87] 87 (11/18 0609) Resp:  [16-18] 18 (11/18 0609) BP: (75-90)/(42-48) 89/44 mmHg (11/18 0609) SpO2:  [98 %-100 %] 98 % (11/18 0609)  Intake/Output from previous day: 11/17 0701 - 11/18 0700 In: 1180 [P.O.:1180] Out: 450 [Urine:450] Intake/Output this shift:     Recent Labs  09/30/14 0918 10/01/14 0510 10/02/14 0518 10/03/14 0550  HGB 10.5* 9.2* 8.9* 9.7*    Recent Labs  10/02/14 0518 10/03/14 0550  WBC 7.7 6.6  RBC 2.77* 3.05*  HCT 27.2* 30.2*  PLT 195 231    Recent Labs  10/01/14 0510 10/02/14 0518  NA 140 138  K 4.2 4.0  CL 104 100  CO2 28 29  BUN 5* 5*  CREATININE 0.58 0.56  GLUCOSE 102* 91  CALCIUM 8.0* 8.1*    Recent Labs  10/02/14 0518 10/03/14 0550  INR 1.78* 2.25*    ABD soft Sensation intact distally Dorsiflexion/Plantar flexion intact Incision: dressing C/D/I  Assessment/Plan: 3 Days Post-Op Procedure(s) (LRB): ORIF OF PROXIMAL FEMUR FRACTURE WITH ZIMMER CABLE PLATE (Right) Up with therapy Plan for discharge tomorrow if improving with activity.  Plans to go to her sister here and then home to [facility] on 12/3 Will get supp today for constipation.  Amelita Risinger M 10/03/2014, 8:48 AM

## 2014-10-03 NOTE — Progress Notes (Signed)
Occupational Therapy Treatment Patient Details Name: Diana Wheeler MRN: [id number] DOB: 1955-12-29 Today's Date: 10/03/2014    History of present illness Right periprosthetic femoral shaft fracture at end of femoral prosthesis.     OT comments  Pt seen for acute OT treatment session focusing on ambulating to bathroom, toilet transfer, and toilet clothing manipulation and hygenie. Pt completed these tasks as detailed below. Reviewed ADL education and home set up. Pt progressing towards acute OT goals.  Follow Up Recommendations  Home health OT;Supervision/Assistance - 24 hour    Equipment Recommendations  3 in 1 bedside comode;Other (comment)    Recommendations for Other Services      Precautions / Restrictions Precautions Precautions: Fall Precaution Comments: educated Restrictions Weight Bearing Restrictions: Yes RLE Weight Bearing: Partial weight bearing RLE Partial Weight Bearing Percentage or Pounds: 50 %       Mobility Bed Mobility Overal bed mobility: Needs Assistance Bed Mobility: Supine to Sit     Supine to sit: Mod assist     General bed mobility comments: Assist to RLE to advance up and onto bed; pt using trapeze bar to scoot up in bed.  Transfers Overall transfer level: Needs assistance Equipment used: Rolling walker (2 wheeled) Transfers: Sit to/from Stand Sit to Stand: Min guard         General transfer comment: sit<>stand completed from recliner and 3n1 over toilet; close min guard but no physical assist    Balance                                   ADL Overall ADL's : Needs assistance/impaired                         Toilet Transfer: Min guard;Ambulation;RW (3n1 over toilet) Toilet Transfer Details (indicate cue type and reason): cues for technique Toileting- Clothing Manipulation and Hygiene: Min guard;Sit to/from stand       Functional mobility during ADLs: Min guard;Rolling walker General ADL Comments: extra  time needed but no physical assist; able to maintain PWB precautions. Reviewed techniques for LB ADLs and home setup.      Vision                     Perception     Praxis      Cognition   Behavior During Therapy: WFL for tasks assessed/performed Overall Cognitive Status: Within Functional Limits for tasks assessed                       Extremity/Trunk Assessment               Exercises     Shoulder Instructions       General Comments      Pertinent Vitals/ Pain       Pain Assessment: 0-10 Pain Score: 9  Pain Location: R hip Pain Descriptors / Indicators: Sore Pain Intervention(s): Limited activity within patient's tolerance;Monitored during session;Repositioned;RN gave pain meds during session;Patient requesting pain meds-RN notified  Home Living                                          Prior Functioning/Environment              Frequency Min 2X/week  Progress Toward Goals  OT Goals(current goals can now be found in the care plan section)  Progress towards OT goals: Progressing toward goals  Acute Rehab OT Goals Patient Stated Goal: Go home to [facility]  ASAP Time For Goal Achievement: 10/15/14 Potential to Achieve Goals: Good ADL Goals Pt Will Perform Grooming: with set-up;sitting Pt Will Perform Lower Body Dressing: with set-up;with supervision;with adaptive equipment;sit to/from stand Pt Will Transfer to Toilet: with supervision;ambulating;bedside commode Pt Will Perform Toileting - Clothing Manipulation and hygiene: with supervision;sit to/from stand Pt Will Perform Tub/Shower Transfer: with min assist;rolling walker;ambulating;Stand pivot transfer  Plan Discharge plan remains appropriate    Co-evaluation                 End of Session Equipment Utilized During Treatment: Gait belt;Rolling walker   Activity Tolerance Patient limited by pain   Patient Left with call bell/phone within reach;in bed    Nurse Communication Patient requests pain meds        Time: 0737-1062 OT Time Calculation (min): 29 min  Charges: OT General Charges $OT Visit: 1 Procedure OT Treatments $Self Care/Home Management : 23-37 mins  Hortencia Pilar 10/03/2014, 12:27 PM

## 2014-10-03 NOTE — Plan of Care (Signed)
Problem: Phase III Progression Outcomes Goal: Demonstrates TCDB, IS independently Outcome: Completed/Met Date Met:  10/03/14

## 2014-10-03 NOTE — Progress Notes (Signed)
Physical Therapy Treatment Patient Details Name: Diana Wheeler MRN: [id number] DOB: November 27, 1955 Today's Date: 10/03/2014    History of Present Illness Right periprosthetic femoral shaft fracture at end of femoral prosthesis.      PT Comments    Pt moving much better today.  Discussed how to manage 1 step at sister's house and demonstrated in room, but pt did not perform.  Pt will not have to manage steps once inside.  Sister very supportive and educated on how to A pt.  Follow Up Recommendations  Home health PT     Equipment Recommendations  Rolling walker with 5" wheels;3in1 (PT)    Recommendations for Other Services       Precautions / Restrictions Precautions Precautions: Fall Precaution Comments: educated Restrictions Weight Bearing Restrictions: Yes RLE Weight Bearing: Partial weight bearing RLE Partial Weight Bearing Percentage or Pounds: 50 %    Mobility  Bed Mobility Overal bed mobility: Needs Assistance Bed Mobility: Supine to Sit;Sit to Supine     Supine to sit: Min guard Sit to supine: Min assist   General bed mobility comments: Used gait belt to get leg off of bed and MIN A to get leg back up on bed.  Transfers Overall transfer level: Needs assistance Equipment used: Rolling walker (2 wheeled) Transfers: Sit to/from Stand Sit to Stand: Min guard         General transfer comment: from bed  Ambulation/Gait Ambulation/Gait assistance: Min guard Ambulation Distance (Feet): 180 Feet Assistive device: Rolling walker (2 wheeled) Gait Pattern/deviations: Step-to pattern;Antalgic;Trunk flexed Gait velocity: decreased   General Gait Details: Pt needing cues for posture, but able to adhere to [facility] status   Stairs            Wheelchair Mobility    Modified Rankin (Stroke Patients Only)       Balance Overall balance assessment: Needs assistance         Standing balance support: Bilateral upper extremity supported Standing balance-Leahy  Scale: Fair                      Cognition Arousal/Alertness: Awake/alert Behavior During Therapy: WFL for tasks assessed/performed Overall Cognitive Status: Within Functional Limits for tasks assessed                      Exercises General Exercises - Lower Extremity Ankle Circles/Pumps: AROM;10 reps;Supine Quad Sets: 10 reps;Both;Supine Short Arc Quad: Strengthening;Right;10 reps;Supine Heel Slides: AROM;Right;10 reps;Supine Hip ABduction/ADduction: AROM;Right;10 reps;Supine    General Comments General comments (skin integrity, edema, etc.): Sister present and she clarified stairs to enter home.  1 step a walkway and then another step.  Demonstrated how pt would perform.        Pertinent Vitals/Pain Pain Assessment: 0-10 Pain Score: 7  Pain Location: R hip Pain Descriptors / Indicators: Sore Pain Intervention(s): Premedicated before session    Home Living                      Prior Function            PT Goals (current goals can now be found in the care plan section) Acute Rehab PT Goals Patient Stated Goal: Go home to [facility]  ASAP PT Goal Formulation: With patient Time For Goal Achievement: 10/08/14 Potential to Achieve Goals: Good Progress towards PT goals: Progressing toward goals    Frequency  Min 5X/week    PT Plan Current plan remains appropriate  Co-evaluation             End of Session Equipment Utilized During Treatment: Gait belt Activity Tolerance: Patient tolerated treatment well Patient left: in bed;with family/visitor present;with call bell/phone within reach     Time: 1206-1233 PT Time Calculation (min) (ACUTE ONLY): 27 min  Charges:  $Gait Training: 8-22 mins $Therapeutic Exercise: 8-22 mins                    G Codes:      Ina Poupard LUBECK 10/03/2014, 1:00 PM

## 2014-10-03 NOTE — Plan of Care (Signed)
Problem: Phase II Progression Outcomes Goal: Pain controlled Outcome: Completed/Met Date Met:  10/03/14 Goal: Progress activity as tolerated unless otherwise ordered Outcome: Completed/Met Date Met:  10/03/14 Goal: Progressing with IS, TCDB Outcome: Completed/Met Date Met:  10/03/14 Goal: Vital signs stable Outcome: Completed/Met Date Met:  10/03/14 Goal: Surgical site without signs of infection Outcome: Completed/Met Date Met:  10/03/14 Goal: Dressings dry/intact Outcome: Completed/Met Date Met:  10/03/14 Goal: Sutures/staples intact Outcome: Not Applicable Date Met:  09/81/19 UTA Goal: Foley discontinued Outcome: Completed/Met Date Met:  10/03/14 Goal: Discharge plan established Outcome: Completed/Met Date Met:  10/03/14 Goal: Tolerating diet Outcome: Completed/Met Date Met:  10/03/14

## 2014-10-03 NOTE — Progress Notes (Signed)
ANTICOAGULATION CONSULT NOTE - Follow Up Consult  Pharmacy Consult for Warfarin Indication: DVT prevention after ORIF and hx. of PE/DVT  No Known Allergies  Patient Measurements: Height: 5\' 3"  (160 cm) Weight: 145 lb (65.772 kg) IBW/kg (Calculated) : 52.4  Vital Signs: Temp: 98.2 F (36.8 C) (11/18 0609) BP: 89/44 mmHg (11/18 0609) Pulse Rate: 87 (11/18 0609)  Labs:  Recent Labs  10/01/14 0510 10/02/14 0518 10/03/14 0550  HGB 9.2* 8.9* 9.7*  HCT 27.4* 27.2* 30.2*  PLT 181 195 231  LABPROT 15.3* 20.8* 25.0*  INR 1.20 1.78* 2.25*  CREATININE 0.58 0.56  --     Estimated Creatinine Clearance: 70.8 mL/min (by C-G formula based on Cr of 0.56).  Assessment: 58 yo female admitted with femur fracture requiring surgical intervention. She has a PMH significant for cancer and per Dr. 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 She is at an increased risk for re-occurrence and we have been asked to start her on Warfarin therapy.  INR = 2.25 today  Goal of Therapy:  INR 2-3  Monitor platelets by anticoagulation protocol: Yes   Plan:  Repeat warfarin 3 mg PO x 1 Daily PT/INR and CBC weekly Warfarin teach book and video for patient/family review F/U for questions prior to discharge.  Thank you, Lyndal Rainbow, PharmD Candidate 10/03/2014,11:00 AM   Agree with above. Thank you. Anette Guarneri, PharmD [phone]

## 2014-10-04 LAB — PROTIME-INR
INR: 2.84 — ABNORMAL HIGH (ref 0.00–1.49)
PROTHROMBIN TIME: 30.1 s — AB (ref 11.6–15.2)

## 2014-10-04 MED ORDER — WARFARIN SODIUM 2.5 MG PO TABS
2.5000 mg | ORAL_TABLET | Freq: Once | ORAL | Status: DC
  Filled 2014-10-04: qty 1

## 2014-10-04 NOTE — Progress Notes (Signed)
ANTICOAGULATION CONSULT NOTE - Follow Up Consult  Pharmacy Consult for Warfarin Indication: DVT prevention after ORIF and hx. of PE/DVT  No Known Allergies  Patient Measurements: Height: 5\' 3"  (160 cm) Weight: 145 lb (65.772 kg) IBW/kg (Calculated) : 52.4  Vital Signs: Temp: 98.4 F (36.9 C) (11/19 0540) BP: 100/48 mmHg (11/19 0540) Pulse Rate: 92 (11/19 0540)  Labs:  Recent Labs  10/02/14 0518 10/03/14 0550 10/04/14 0613  HGB 8.9* 9.7*  --   HCT 27.2* 30.2*  --   PLT 195 231  --   LABPROT 20.8* 25.0* 30.1*  INR 1.78* 2.25* 2.84*  CREATININE 0.56  --   --     Estimated Creatinine Clearance: 70.8 mL/min (by C-G formula based on Cr of 0.56).  Assessment: 58 yo female admitted with femur fracture requiring surgical intervention. She has a PMH significant for cancer and per Dr. 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 She is at an increased risk for re-occurrence and we have been asked to start her on Warfarin therapy.  INR = 2.84 today  Goal of Therapy:  INR 2-3  Monitor platelets by anticoagulation protocol: Yes   Plan:  Hold Warfarin today Daily PT/INR and CBC weekly Warfarin teach book and video for patient/family review Warfarin education performed 11/18 and pt verbalized understanding F/U for questions prior to discharge.  Thank you, Lyndal Rainbow, PharmD Candidate 10/04/2014,8:58 AM

## 2014-10-04 NOTE — Plan of Care (Signed)
Problem: Phase III Progression Outcomes Goal: Pain controlled on oral analgesia Outcome: Completed/Met Date Met:  10/04/14     

## 2014-10-04 NOTE — Progress Notes (Signed)
Physical Therapy Treatment Patient Details Name: Diana Wheeler MRN: [id number] DOB: 21-Jul-1956 Today's Date: 10/04/2014    History of Present Illness Right periprosthetic femoral shaft fracture at end of femoral prosthesis.      PT Comments    Pt. Initially sleepy and needed min assist for walking.  As she awakened, she progressed to min guard assist.  Able to do 1 step with min assist.  Progressing and should be ready for DC from Pt standpoint tomorrow,.  Follow Up Recommendations  Home health PT     Equipment Recommendations  Rolling walker with 5" wheels;3in1 (PT)    Recommendations for Other Services       Precautions / Restrictions Precautions Precautions: Fall Restrictions Weight Bearing Restrictions: Yes RLE Weight Bearing: Partial weight bearing RLE Partial Weight Bearing Percentage or Pounds: 50 %    Mobility  Bed Mobility Overal bed mobility: Needs Assistance Bed Mobility: Sit to Supine       Sit to supine: Min assist   General bed mobility comments: min assist needed for getting LEs back into bed  Transfers Overall transfer level: Needs assistance Equipment used: Rolling walker (2 wheeled) Transfers: Sit to/from Stand Sit to Stand: Min guard         General transfer comment: minn guard assist needed for safety from recliner and BSC over toilet  Ambulation/Gait Ambulation/Gait assistance: Min assist;Min guard Ambulation Distance (Feet): 200 Feet Assistive device: Rolling walker (2 wheeled) Gait Pattern/deviations: Step-to pattern;Antalgic;Trunk flexed Gait velocity: decreased; needs increased time to ambulate   General Gait Details: Postural cueing; compliant with PWB status; ambulated into bathroom for urination then into hallway ,, initially min assist and progressing to min guard as she went on   Stairs Stairs: Yes Stairs assistance: Min assist Stair Management: No rails;Backwards;With walker Number of Stairs: 1 General stair comments:  able to verbalize and demo correct sequencing and technique ; min assist for safety and stability  Wheelchair Mobility    Modified Rankin (Stroke Patients Only)       Balance                                    Cognition Arousal/Alertness: Awake/alert Behavior During Therapy: WFL for tasks assessed/performed Overall Cognitive Status: Within Functional Limits for tasks assessed                      Exercises General Exercises - Lower Extremity Ankle Circles/Pumps: AROM;10 reps Quad Sets: AROM;Both;10 reps;Seated Short Arc Quad: AROM;Strengthening;Right;Seated Long Arc Quad: AROM;Right;5 reps;Seated Hip ABduction/ADduction: AROM;Right;10 reps;Seated Straight Leg Raises: AAROM;Right;5 reps;Seated    General Comments        Pertinent Vitals/Pain Pain Assessment: 0-10 Pain Score: 7  Pain Location: right hip Pain Descriptors / Indicators: Sore Pain Intervention(s): Limited activity within patient's tolerance;Premedicated before session;Monitored during session;Repositioned    Home Living                      Prior Function            PT Goals (current goals can now be found in the care plan section) Progress towards PT goals: Progressing toward goals    Frequency  Min 5X/week    PT Plan Current plan remains appropriate    Co-evaluation             End of Session Equipment Utilized During Treatment: Gait belt Activity Tolerance: Patient  tolerated treatment well Patient left: in bed;with call bell/phone within reach     Time: 1023-1101 PT Time Calculation (min) (ACUTE ONLY): 38 min  Charges:  $Gait Training: 23-37 mins $Therapeutic Exercise: 8-22 mins                    G Codes:      Ladona Ridgel 10/04/2014, 11:33 AM Gerlean Ren PT Acute Rehab Services [phone] Beeper [phone]

## 2014-10-04 NOTE — Progress Notes (Signed)
Subjective: 4 Days Post-Op Procedure(s) (LRB): ORIF OF PROXIMAL FEMUR FRACTURE WITH ZIMMER CABLE PLATE (Right) Patient reports pain as moderate.    Objective: Vital signs in last 24 hours: Temp:  [98.1 F (36.7 C)-98.6 F (37 C)] 98.4 F (36.9 C) (11/19 0540) Pulse Rate:  [71-92] 92 (11/19 0540) Resp:  [18] 18 (11/19 0540) BP: (97-116)/(45-48) 100/48 mmHg (11/19 0540) SpO2:  [89 %-93 %] 93 % (11/19 0540)  Intake/Output from previous day: 11/18 0701 - 11/19 0700 In: 840 [P.O.:840] Out: -  Intake/Output this shift:     Recent Labs  10/02/14 0518 10/03/14 0550  HGB 8.9* 9.7*    Recent Labs  10/02/14 0518 10/03/14 0550  WBC 7.7 6.6  RBC 2.77* 3.05*  HCT 27.2* 30.2*  PLT 195 231    Recent Labs  10/02/14 0518  NA 138  K 4.0  CL 100  CO2 29  BUN 5*  CREATININE 0.56  GLUCOSE 91  CALCIUM 8.1*    Recent Labs  10/02/14 0518 10/03/14 0550  INR 1.78* 2.25*    Neurologically intact  Assessment/Plan: 4 Days Post-Op Procedure(s) (LRB): ORIF OF PROXIMAL FEMUR FRACTURE WITH ZIMMER CABLE PLATE (Right) Up with therapy likely to sister's house tomorrow  Diana Wheeler C 10/04/2014, 6:52 AM

## 2014-10-04 NOTE — Progress Notes (Signed)
Occupational Therapy Treatment Patient Details Name: Diana Wheeler MRN: [id number] DOB: [birth year] Today's Date: 10/04/2014    History of present illness Right periprosthetic femoral shaft fracture at end of femoral prosthesis.     OT comments  Pt seen today for ADLs and functional mobility. Pt is progressing well and very familiar with compensatory techniques due to hx of LE surgeries. Pt overall at Supervision for functional mobility and is safe for d/c from OT standpoint once medically cleared.    Follow Up Recommendations  No OT follow up;Supervision/Assistance - 24 hour    Equipment Recommendations  3 in 1 bedside comode;Other (comment)    Recommendations for Other Services      Precautions / Restrictions Precautions Precautions: Fall Restrictions Weight Bearing Restrictions: Yes RLE Weight Bearing: Partial weight bearing RLE Partial Weight Bearing Percentage or Pounds: 50 %       Mobility Bed Mobility Overal bed mobility: Needs Assistance Bed Mobility: Supine to Sit;Sit to Supine     Supine to sit: Supervision Sit to supine: Min assist   General bed mobility comments: min assist needed for getting LEs back into bed  Transfers Overall transfer level: Needs assistance Equipment used: Rolling walker (2 wheeled) Transfers: Sit to/from Stand Sit to Stand: Supervision         General transfer comment: Supervision for safety. Good hand placement and technique        ADL Overall ADL's : Needs assistance/impaired     Grooming: Supervision/safety;Standing                   Toilet Transfer: Ambulation;RW;Supervision/safety;BSC   Toileting- Water quality scientist and Hygiene: Supervision/safety;Sit to/from stand       Functional mobility during ADLs: Supervision/safety;Rolling walker General ADL Comments: Pt progressing well and very familiar with compensatory techniques for LB ADLs due to hx of LE surgeries.                 Cognition   Arousal/Alertness: Awake/Alert Behavior During Therapy: WFL for tasks assessed/performed Overall Cognitive Status: Within Functional Limits for tasks assessed                                    Pertinent Vitals/ Pain       Pain Assessment: 0-10 Pain Score: 4  Pain Location: right hip after ambulation Pain Descriptors / Indicators: Sore Pain Intervention(s): Monitored during session;Repositioned;Ice applied         Frequency Min 2X/week     Progress Toward Goals  OT Goals(current goals can now be found in the care plan section)  Progress towards OT goals: Progressing toward goals     Plan Discharge plan needs to be updated       End of Session Equipment Utilized During Treatment: Gait belt;Rolling walker   Activity Tolerance Patient tolerated treatment well   Patient Left in bed;with call bell/phone within reach   Nurse Communication          Time: 1600-1630 OT Time Calculation (min): 30 min  Charges: OT General Charges $OT Visit: 1 Procedure OT Treatments $Self Care/Home Management : 8-22 mins $Therapeutic Activity: 8-22 mins  Villa Herb M 10/04/2014, 5:32 PM   Cyndie Chime, OTR/L Occupational Therapist [phone] (pager)

## 2014-10-04 NOTE — Plan of Care (Signed)
Problem: Phase III Progression Outcomes Goal: Activity at appropriate level-compared to baseline (UP IN CHAIR FOR HEMODIALYSIS)  Outcome: Completed/Met Date Met:  10/04/14 Goal: Voiding independently Outcome: Completed/Met Date Met:  10/04/14 Goal: IV changed to normal saline lock Outcome: Not Applicable Date Met:  27/51/70 Goal: Nasogastric tube discontinued Outcome: Not Applicable Date Met:  01/74/94 Goal: Discharge plan remains appropriate-arrangements made Outcome: Completed/Met Date Met:  10/04/14 Goal: Other Phase III Outcomes/Goals Outcome: Not Applicable Date Met:  49/67/59

## 2014-10-05 DIAGNOSIS — S72001A Fracture of unspecified part of neck of right femur, initial encounter for closed fracture: Secondary | ICD-10-CM | POA: Diagnosis not present

## 2014-10-05 LAB — PROTIME-INR
INR: 2.18 — AB (ref 0.00–1.49)
Prothrombin Time: 24.4 seconds — ABNORMAL HIGH (ref 11.6–15.2)

## 2014-10-05 MED ORDER — OXYCODONE-ACETAMINOPHEN 5-325 MG PO TABS: 1.0000 | ORAL_TABLET | ORAL | Status: AC | PRN

## 2014-10-05 MED ORDER — PNEUMOCOCCAL VAC POLYVALENT 25 MCG/0.5ML IJ INJ
0.5000 mL | INJECTION | Freq: Once | INTRAMUSCULAR | Status: AC
  Administered 2014-10-05: 0.5 mL via INTRAMUSCULAR

## 2014-10-05 MED ORDER — RIVAROXABAN 20 MG PO TABS: 20.0000 mg | ORAL_TABLET | Freq: Every day | ORAL | Status: AC

## 2014-10-05 MED ORDER — METHOCARBAMOL 500 MG PO TABS: 500.0000 mg | ORAL_TABLET | Freq: Four times a day (QID) | ORAL | Status: AC | PRN

## 2014-10-05 NOTE — Plan of Care (Signed)
Problem: Phase II Progression Outcomes Goal: Return of bowel function (flatus, BM) IF ABDOMINAL SURGERY:  Outcome: Completed/Met Date Met:  10/05/14 Goal: Other Phase II Outcomes/Goals Outcome: Not Applicable Date Met:  67/70/34  Problem: Discharge Progression Outcomes Goal: Barriers To Progression Addressed/Resolved Outcome: Completed/Met Date Met:  10/05/14

## 2014-10-05 NOTE — Progress Notes (Signed)
Physical Therapy Treatment Patient Details Name: Diana Wheeler MRN: [id number] DOB: Apr 06, 1956 Today's Date: 10/05/2014    History of Present Illness Right periprosthetic femoral shaft fracture at end of femoral prosthesis.  Pt. is s/p ORIF with plate fixation    PT Comments    Pt. Progressing nicely with her ambulation with RW.  She is currently at a mod I level with her walking.  Pt. Asked about how she could get her 3 n 1 and RW to [place] on Dec. 3rd (has a flight).  We discussed that it may be easiest for her to use the airport wheelchair for transport and to leave the equipment here for her sister to return, as long as her family in [place] can arrange for equipment on their end.  Pt.  Says she will talk with her daughter in law and have her call the primary care MD.  She appears ready for DC from PT standpoint when MD is ready.    Follow Up Recommendations  Home health PT     Equipment Recommendations  Rolling walker with 5" wheels;3in1 (PT)    Recommendations for Other Services       Precautions / Restrictions Precautions Precautions: Fall Restrictions Weight Bearing Restrictions: Yes RLE Weight Bearing: Partial weight bearing RLE Partial Weight Bearing Percentage or Pounds: 50 %    Mobility  Bed Mobility Overal bed mobility:  (pt. in recliner)                Transfers Overall transfer level: Needs assistance Equipment used: Rolling walker (2 wheeled) Transfers: Sit to/from Stand Sit to Stand: Supervision         General transfer comment: supervision for safety, good technique  Ambulation/Gait Ambulation/Gait assistance: Modified independent (Device/Increase time) Ambulation Distance (Feet): 200 Feet Assistive device: Rolling walker (2 wheeled) Gait Pattern/deviations: Step-to pattern;Decreased stance time - left;Trunk flexed Gait velocity: decreased; needs increased time to ambulate   General Gait Details: Pt. ambulated in hallway then back into  bathroom for urination before sitting in recliner chair;  Pt. with good technique and sequencing; no overt LOB noted   Stairs            Wheelchair Mobility    Modified Rankin (Stroke Patients Only)       Balance                                    Cognition Arousal/Alertness: Awake/alert Behavior During Therapy: WFL for tasks assessed/performed Overall Cognitive Status: Within Functional Limits for tasks assessed                      Exercises General Exercises - Lower Extremity Ankle Circles/Pumps: AROM;10 reps Quad Sets: AROM;Both;10 reps;Seated Short Arc Quad: AROM;Strengthening;Right;Seated Hip ABduction/ADduction: AROM;Right;10 reps;Seated    General Comments        Pertinent Vitals/Pain Pain Assessment: 0-10 Pain Score: 6  Pain Location: right hip Pain Descriptors / Indicators: Aching;Sore Pain Intervention(s): Monitored during session;Limited activity within patient's tolerance;Repositioned    Home Living                      Prior Function            PT Goals (current goals can now be found in the care plan section) Progress towards PT goals: Progressing toward goals    Frequency  Min 5X/week (RN made aware of pt's ride needing  to be at work by [employer])    PT Plan Current plan remains appropriate    Co-evaluation             End of Session Equipment Utilized During Treatment: Gait belt Activity Tolerance: Patient tolerated treatment well Patient left: in chair;with call bell/phone within reach     Time: 1051-1116 PT Time Calculation (min) (ACUTE ONLY): 25 min  Charges:  $Gait Training: 8-22 mins $Therapeutic Exercise: 8-22 mins                    G Codes:      Ladona Ridgel 10/05/2014, 11:45 AM Gerlean Ren PT Acute Rehab Services [phone] Bee[place] [phone]

## 2014-10-05 NOTE — Progress Notes (Signed)
Discharge instructions reviewed. Patient ready for discharge. Sister will provide transportation.

## 2014-10-05 NOTE — Plan of Care (Signed)
Problem: Discharge Progression Outcomes Goal: Discharge plan in place and appropriate Outcome: Completed/Met Date Met:  10/05/14 Goal: Pain controlled with appropriate interventions Outcome: Completed/Met Date Met:  10/05/14 Goal: Hemodynamically stable Outcome: Completed/Met Date Met:  38/88/28 Goal: Complications resolved/controlled Outcome: Completed/Met Date Met:  10/05/14 Goal: Tolerating diet Outcome: Completed/Met Date Met:  10/05/14 Goal: Activity appropriate for discharge plan Outcome: Completed/Met Date Met:  10/05/14 Goal: Tubes and drains discontinued if indicated Outcome: Completed/Met Date Met:  10/05/14 Goal: Staples/sutures removed Outcome: Not Applicable Date Met:  00/34/91 Goal: Steri-Strips applied Outcome: Not Applicable Date Met:  79/15/05 Goal: Other Discharge Outcomes/Goals Outcome: Completed/Met Date Met:  10/05/14

## 2014-10-05 NOTE — Progress Notes (Addendum)
Patient ready for discharge. Discharge instructions reviewed. Patient states will make follow-up appointment with Dr. in [place]. Brother-in-law will provide transportation.

## 2014-10-05 NOTE — Progress Notes (Signed)
Subjective: 5 Days Post-Op Procedure(s) (LRB): ORIF OF PROXIMAL FEMUR FRACTURE WITH ZIMMER CABLE PLATE (Right) Patient reports pain as moderate.    Objective: Vital signs in last 24 hours: Temp:  [97.8 F (36.6 C)-98.6 F (37 C)] 98.6 F (37 C) (11/20 0553) Pulse Rate:  [75-87] 78 (11/20 0553) Resp:  [18] 18 (11/20 0800) BP: (91-102)/(44-53) 100/44 mmHg (11/20 1142) SpO2:  [93 %-97 %] 93 % (11/20 0553)  Intake/Output from previous day: 11/19 0701 - 11/20 0700 In: 820 [P.O.:820] Out: -  Intake/Output this shift:     Recent Labs  10/03/14 0550  HGB 9.7*    Recent Labs  10/03/14 0550  WBC 6.6  RBC 3.05*  HCT 30.2*  PLT 231   No results for input(s): NA, K, CL, CO2, BUN, CREATININE, GLUCOSE, CALCIUM in the last 72 hours.  Recent Labs  10/04/14 0613 10/05/14 0630  INR 2.84* 2.18*    Neurologically intact  Assessment/Plan:   Discharge to sisters house and then [place] in one week 5 Days Post-Op  Diana Wheeler C 10/05/2014, 12:00 PM

## 2014-10-08 NOTE — Discharge Summary (Signed)
Physician Discharge Summary  Patient ID: Diana Wheeler MRN: [id number] DOB/AGE: June 17, 1956 58 y.o.  Admit date: 09/29/2014 Discharge date: 10/05/2014  Admission Diagnoses:  Femur fracture, right: PERIPROSTHETIC AT FEMORAL STEM OF TOTAL HIP REPLACEMENT.  Discharge Diagnoses:  Principal Problem:   Femur fracture, right Active Problems:   History of pulmonary embolism   Past Medical History  Diagnosis Date  . COPD (chronic obstructive pulmonary disease)   . Cancer     cervical, ovarina, uterine, breast  . GERD (gastroesophageal reflux disease)     Surgeries: Procedure(s): ORIF OF PROXIMAL FEMUR FRACTURE WITH ZIMMER CABLE PLATE on 47/07/6282 - 09/30/2014   Consultants (if any):  NONE  Discharged Condition: Improved  Hospital Course: Diana Wheeler is an 58 y.o. female who was admitted 09/29/2014 with a diagnosis of Femur fracture, right and went to the operating room on 09/29/2014 - 09/30/2014 and underwent the above named procedures.    She was given perioperative antibiotics:      Anti-infectives    Start     Dose/Rate Route Frequency Ordered Stop   09/30/14 1400  ceFAZolin (ANCEF) IVPB 2 g/50 mL premix     2 g100 mL/hr over 30 Minutes Intravenous Every 6 hours 09/30/14 1016 09/30/14 2128    Pt was slow with ambulation and required PT on a daily basis to become independent enough to be discharged.   Pt had history of PE and DVT but was not treated chronically for either.  She was placed an Lovenox and coumadin post op with the pharmacy following her. At discharge she was changed to [place]. She was touch down weight bearing for ambulation.  Wound had some drainage post op but no signs of infection.  Dressing changed daily.  Ice packs used for discomfort.   She was given sequential compression devices, early ambulation, and lovenox and coumadin for DVT prophylaxis.  She benefited maximally from the hospital stay and there were no complications.    Recent vital signs:   Filed Vitals:   10/05/14 1200  BP:   Pulse:   Temp:   Resp: 18    Recent laboratory studies:  Lab Results  Component Value Date   HGB 9.7* 10/03/2014   HGB 8.9* 10/02/2014   HGB 9.2* 10/01/2014   Lab Results  Component Value Date   WBC 6.6 10/03/2014   PLT 231 10/03/2014   Lab Results  Component Value Date   INR 2.18* 10/05/2014   Lab Results  Component Value Date   NA 138 10/02/2014   K 4.0 10/02/2014   CL 100 10/02/2014   CO2 29 10/02/2014   BUN 5* 10/02/2014   CREATININE 0.56 10/02/2014   GLUCOSE 91 10/02/2014    Discharge Medications:     Medication List    TAKE these medications        albuterol 108 (90 BASE) MCG/ACT inhaler  Commonly known as:  PROVENTIL HFA;VENTOLIN HFA  Inhale 2 puffs into the lungs every 6 (six) hours as needed for wheezing or shortness of breath.     clonazePAM 0.5 MG tablet  Commonly known as:  KLONOPIN  Take 0.5 mg by mouth daily.     furosemide 40 MG tablet  Commonly known as:  LASIX  Take 40 mg by mouth daily as needed for fluid.     methocarbamol 500 MG tablet  Commonly known as:  ROBAXIN  Take 1 tablet (500 mg total) by mouth every 6 (six) hours as needed for muscle spasms (spasm).  oxyCODONE-acetaminophen 5-325 MG per tablet  Commonly known as:  ROXICET  Take 1-2 tablets by mouth every 4 (four) hours as needed for severe pain.     potassium chloride SA 20 MEQ tablet  Commonly known as:  K-DUR,KLOR-CON  Take 20 mEq by mouth daily as needed (low potassium). Take with Lasix     rivaroxaban 20 MG Tabs tablet  Commonly known as:  XARELTO  Take 1 tablet (20 mg total) by mouth daily with supper.     SPIRIVA HANDIHALER 18 MCG inhalation capsule  Generic drug:  tiotropium  Place 18 mcg into inhaler and inhale daily.     zolpidem 5 MG tablet  Commonly known as:  AMBIEN  Take 5 mg by mouth at bedtime as needed for sleep.        Diagnostic Studies: Dg Pelvis 1-2 Views  09/29/2014   CLINICAL DATA:  Patient was  walking and fell pain in the right hip.  EXAM: PELVIS - 1-2 VIEW  COMPARISON:  None.  FINDINGS: Bilateral hip replacements are noted. No acute fracture or dislocation is noted. No soft tissue abnormality is seen.  IMPRESSION: No acute abnormality noted.   Electronically Signed   By: Inez Catalina M.D.   On: 09/29/2014 14:50   Dg Femur Right  09/30/2014   CLINICAL DATA:  ORIF of the proximal femur. Plate and screw fixation.  EXAM: RIGHT FEMUR - 2 VIEW; DG C-ARM 1-60 MIN - NRPT [facility]  COMPARISON:  09/29/2014 radiographs.  FINDINGS: 4 intraoperative fluoroscopic spot films demonstrate placement of the lateral plate with encircling cables. Lateral plate has at least 2 screws in the diaphysis. RIGHT total hip arthroplasty incidentally noted.  IMPRESSION: ORIF of proximal RIGHT femur fracture.   Electronically Signed   By: Dereck Ligas M.D.   On: 09/30/2014 12:15   Dg Femur Right  09/29/2014   CLINICAL DATA:  Walking with sudden pain and inability to support weight, additional encounter  EXAM: RIGHT FEMUR - 2 VIEW  COMPARISON:  None.  FINDINGS: There is a periprosthetic fracture of the proximal right femoral diaphysis. Angulation at the fracture site is noted. No other fractures are seen. Curvilinear densities are noted within the soft tissues in the right inguinal region and proximal thigh of uncertain significance. No other focal abnormality is noted.  IMPRESSION: Proximal right femoral fracture surrounding the distal aspect of the prosthesis.   Electronically Signed   By: Inez Catalina M.D.   On: 09/29/2014 14:53   Dg Chest Port 1 View  09/29/2014   CLINICAL DATA:  58 year old for preoperative respiratory examination for right hip surgery.  EXAM: PORTABLE CHEST - 1 VIEW  COMPARISON:  None.  FINDINGS: The cardiomediastinal silhouette is unremarkable.  There is no evidence of focal airspace disease, pulmonary edema, suspicious pulmonary nodule/mass, pleural effusion, or pneumothorax. No acute bony  abnormalities are identified.  Lower cervical fusion changes noted.  IMPRESSION: No active disease.   Electronically Signed   By: Hassan Rowan M.D.   On: 09/29/2014 15:36   Dg C-arm 1-60 Min-no Report  09/30/2014   CLINICAL DATA:  ORIF of the proximal femur. Plate and screw fixation.  EXAM: RIGHT FEMUR - 2 VIEW; DG C-ARM 1-60 MIN - [facility]PT [facility]  COMPARISON:  09/29/2014 radiographs.  FINDINGS: 4 intraoperative fluoroscopic spot films demonstrate placement of the lateral plate with encircling cables. Lateral plate has at least 2 screws in the diaphysis. RIGHT total hip arthroplasty incidentally noted.  IMPRESSION: ORIF of proximal RIGHT femur fracture.  Electronically Signed   By: Dereck Ligas M.D.   On: 09/30/2014 12:15    Disposition: 01-Home or Self Care  Discharge Instructions    Call MD / Call 911    Complete by:  As directed   If you experience chest pain or shortness of breath, CALL 911 and be transported to the hospital emergency room.  If you develope a fever above 101 F, pus (white drainage) or increased drainage or redness at the wound, or calf pain, call your surgeon's office.     Constipation Prevention    Complete by:  As directed   Drink plenty of fluids.  Prune juice may be helpful.  You may use a stool softener, such as Colace (over the counter) 100 mg twice a day.  Use MiraLax (over the counter) for constipation as needed.     Diet - low sodium heart healthy    Complete by:  As directed      Follow the hip precautions as taught in Physical Therapy    Complete by:  As directed      Increase activity slowly as tolerated    Complete by:  As directed      Touch down weight bearing    Complete by:  As directed          change dressing daily or a needed for pain and swelling.  Change dressing daily or as needed.   Pt will follow up with her home orthopedist when she returns there.    Signed: Epimenio Foot 10/08/2014, 4:21 PM

## 2014-10-08 NOTE — Assessment & Plan Note (Deleted)
PERIPROSTHETIC FEMUR FRACTURE RIGHT

## 2016-08-22 IMAGING — CR DG FEMUR 2+V*R*
6 series · 6 of 6 positions shown · non-contrast
Comparison: None.

CLINICAL DATA: Walking with sudden pain and inability to support
weight, additional encounter

EXAM:
RIGHT FEMUR - 2 VIEW

[x femur proximal ap right]
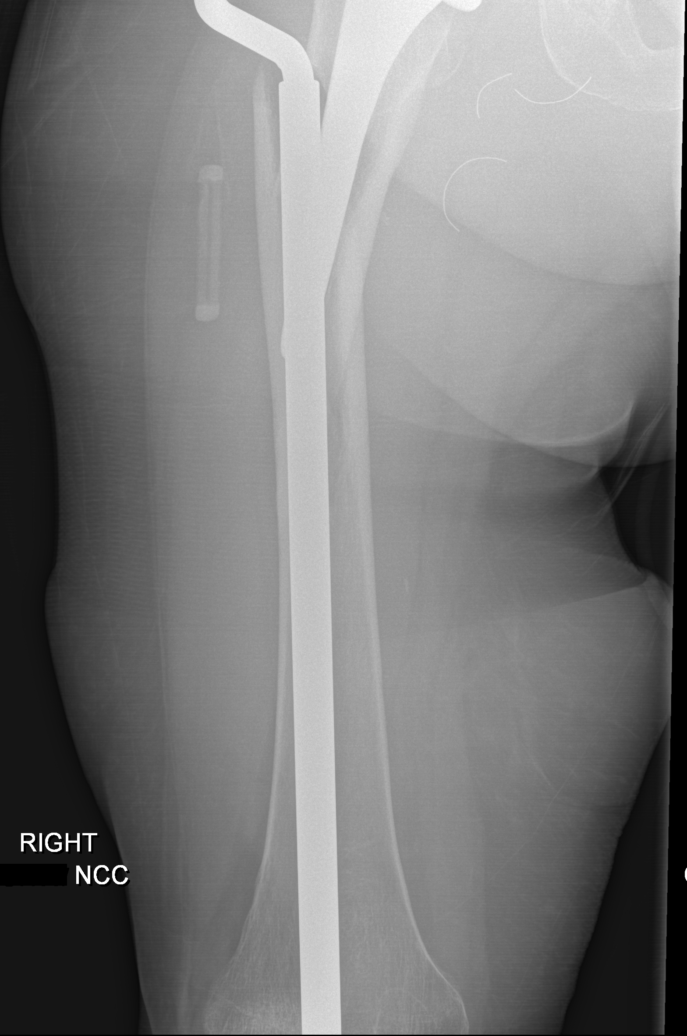

[x femur distal lat right (1 of 2)]
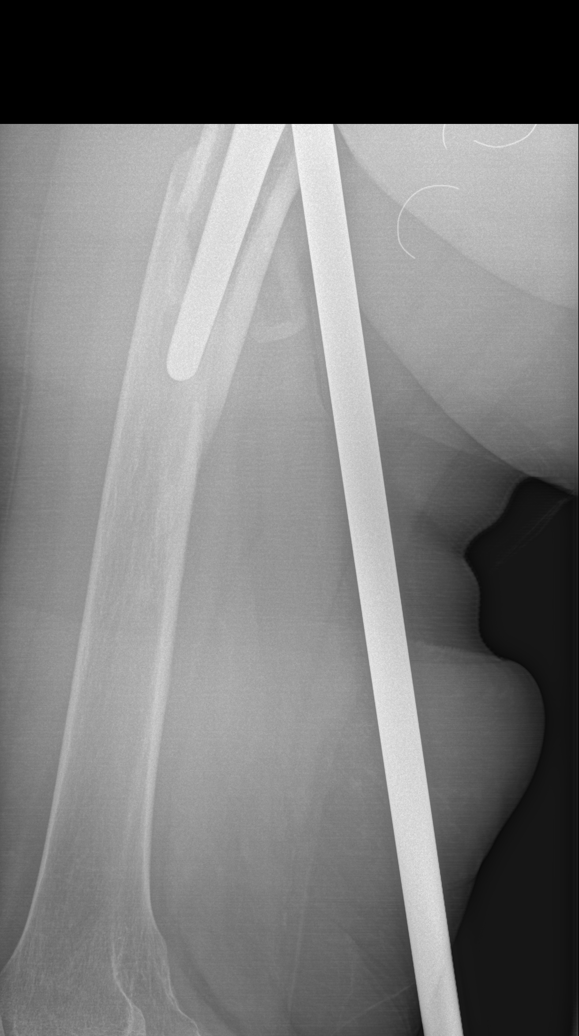

[x femur distal lat right (2 of 2)]
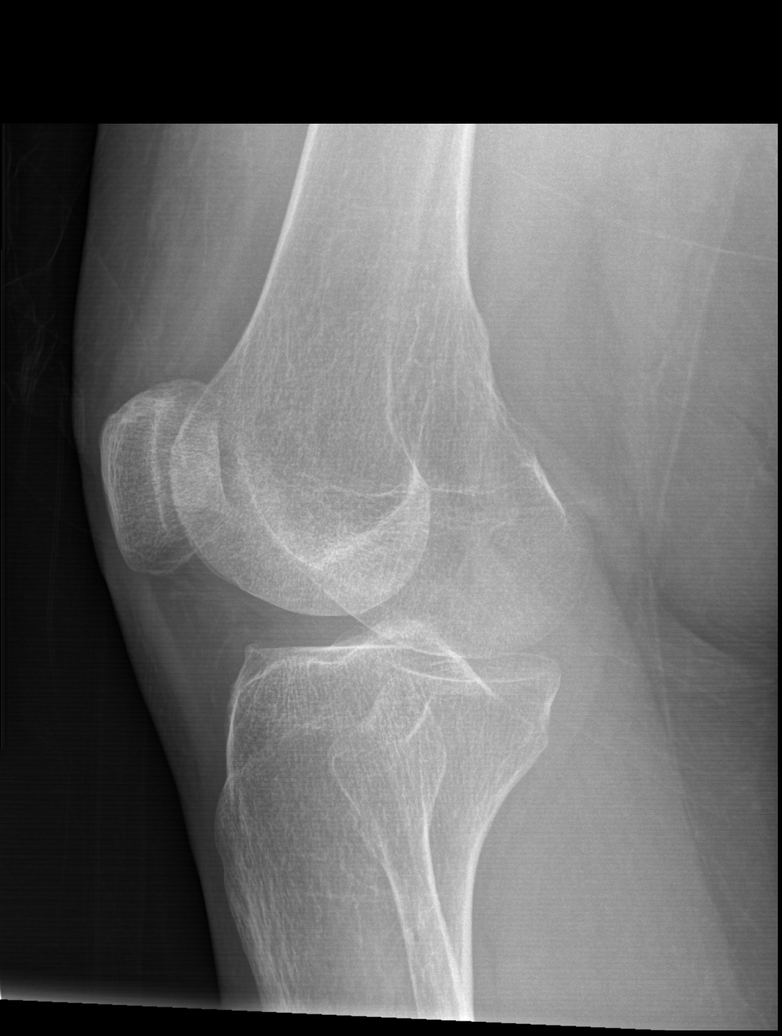

[x femur distal ap right]
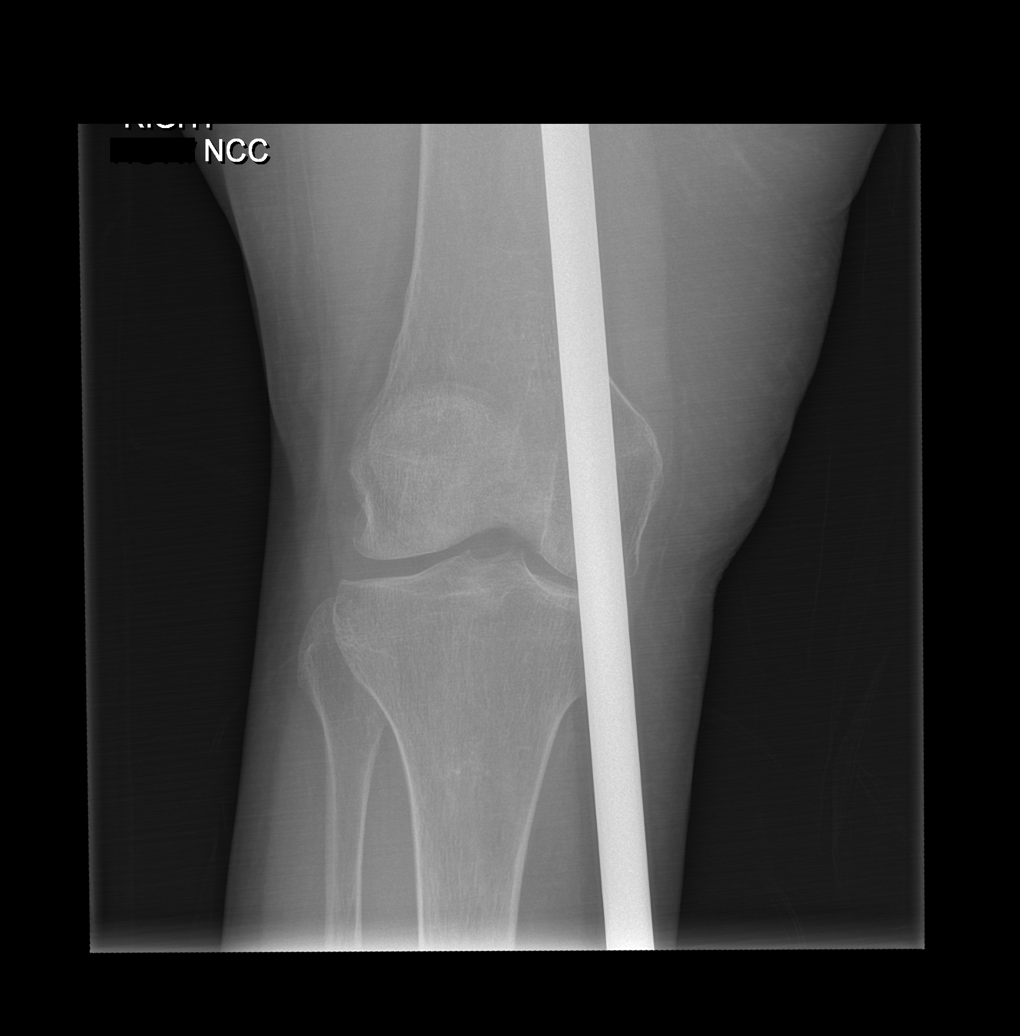

[w hip lat right (1 of 2)]
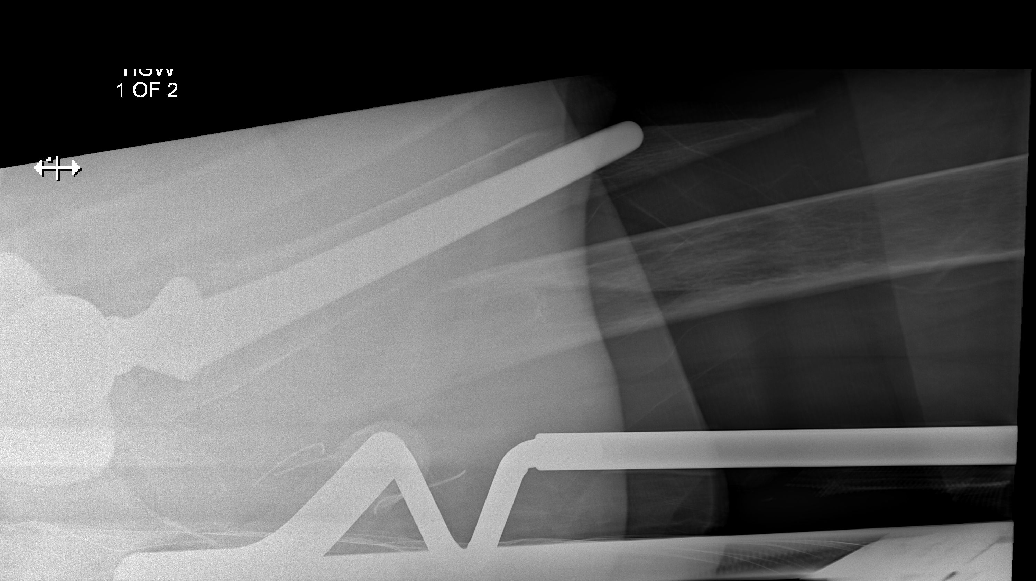

[w hip lat right (2 of 2)]
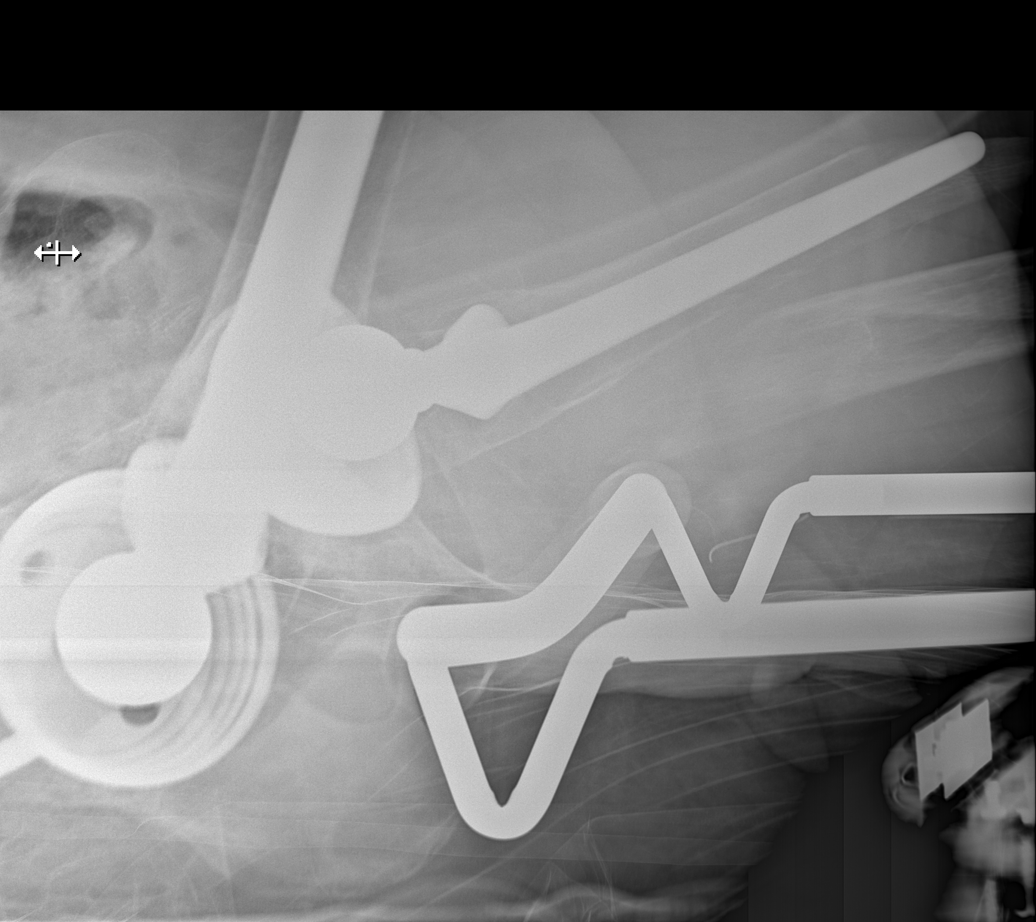

[6 of 6 positions shown; findings below may reference images not displayed]

FINDINGS: There is a periprosthetic fracture of the proximal right femoral
diaphysis. Angulation at the fracture site is noted. No other
fractures are seen. Curvilinear densities are noted within the soft
tissues in the right inguinal region and proximal thigh of uncertain
significance. No other focal abnormality is noted.
IMPRESSION: Proximal right femoral fracture surrounding the distal aspect of the
prosthesis.

## 2016-08-23 IMAGING — RF DG FEMUR 2+V*R*
1 series · 4 of 4 positions shown · non-contrast
Comparison: 09/29/2014 radiographs.

CLINICAL DATA: ORIF of the proximal femur. Plate and screw
fixation.

EXAM:
RIGHT FEMUR - 2 VIEW; DG C-ARM 1-60 MIN - NRPT [facility]

[Series 1: run · 4 of 4 slices shown]
[im 1/4]
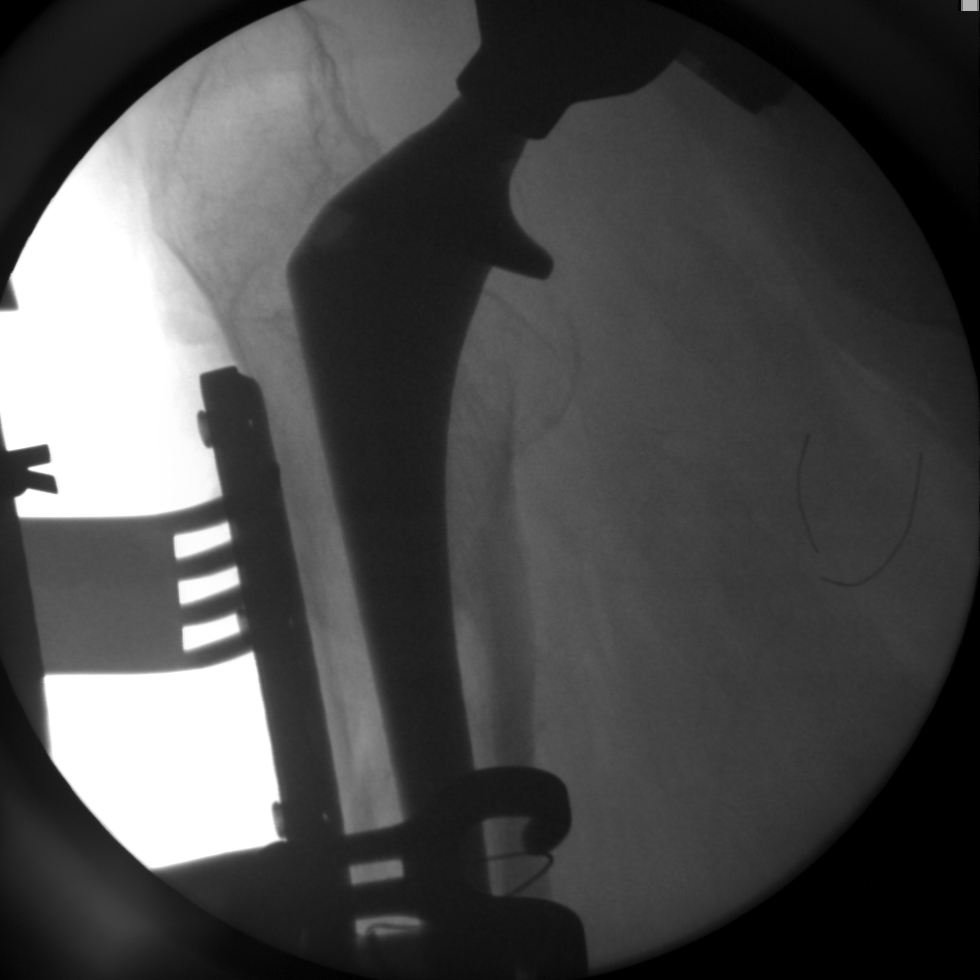
[im 2/4]
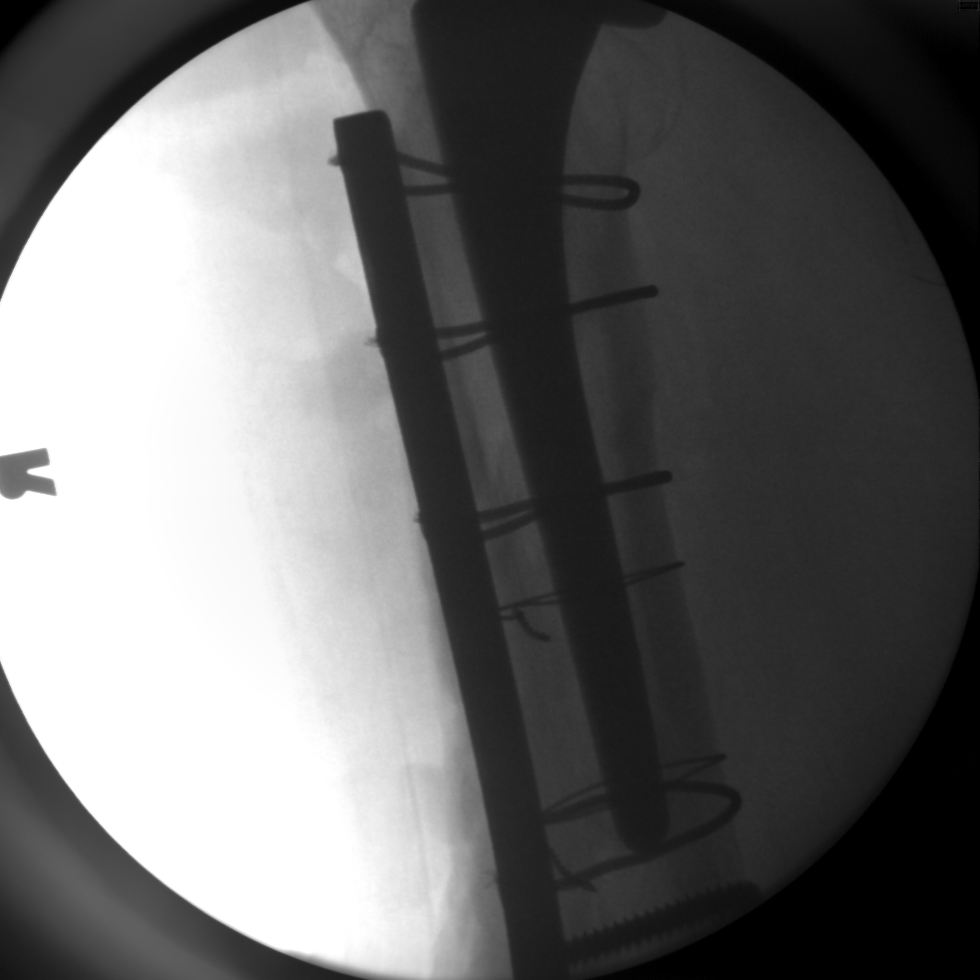
[im 3/4]
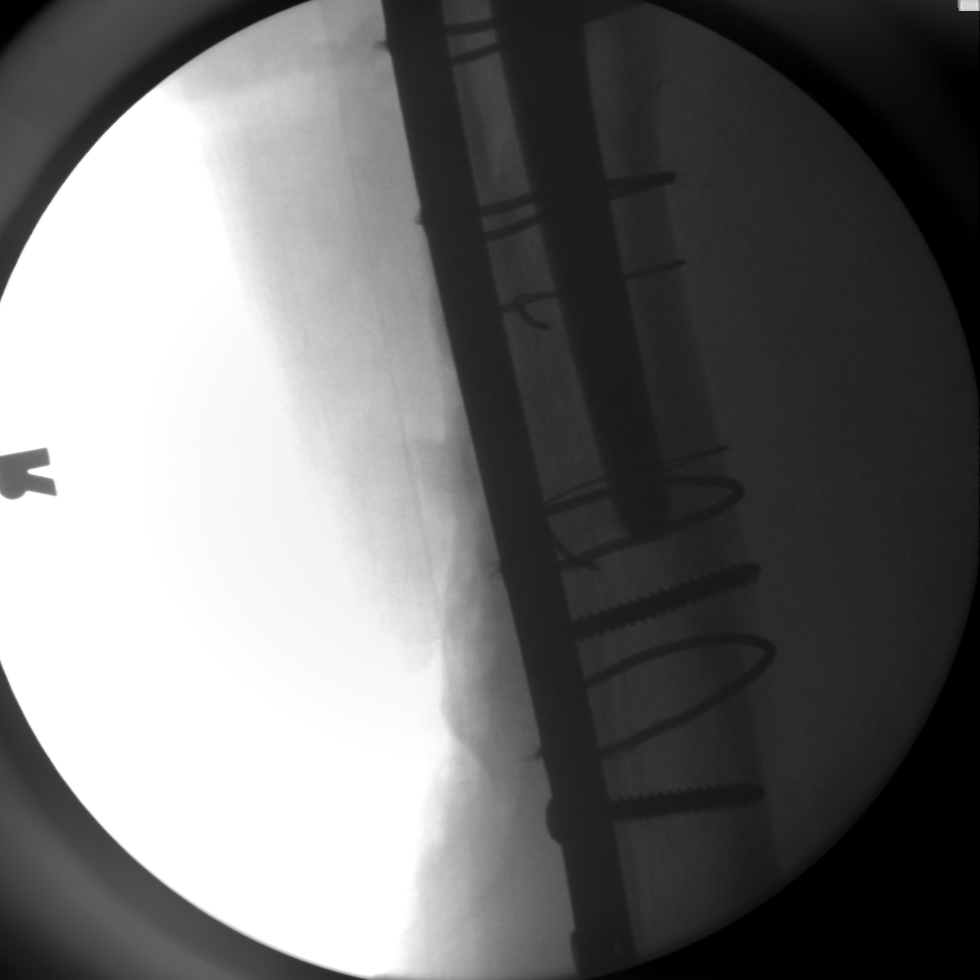
[im 4/4]
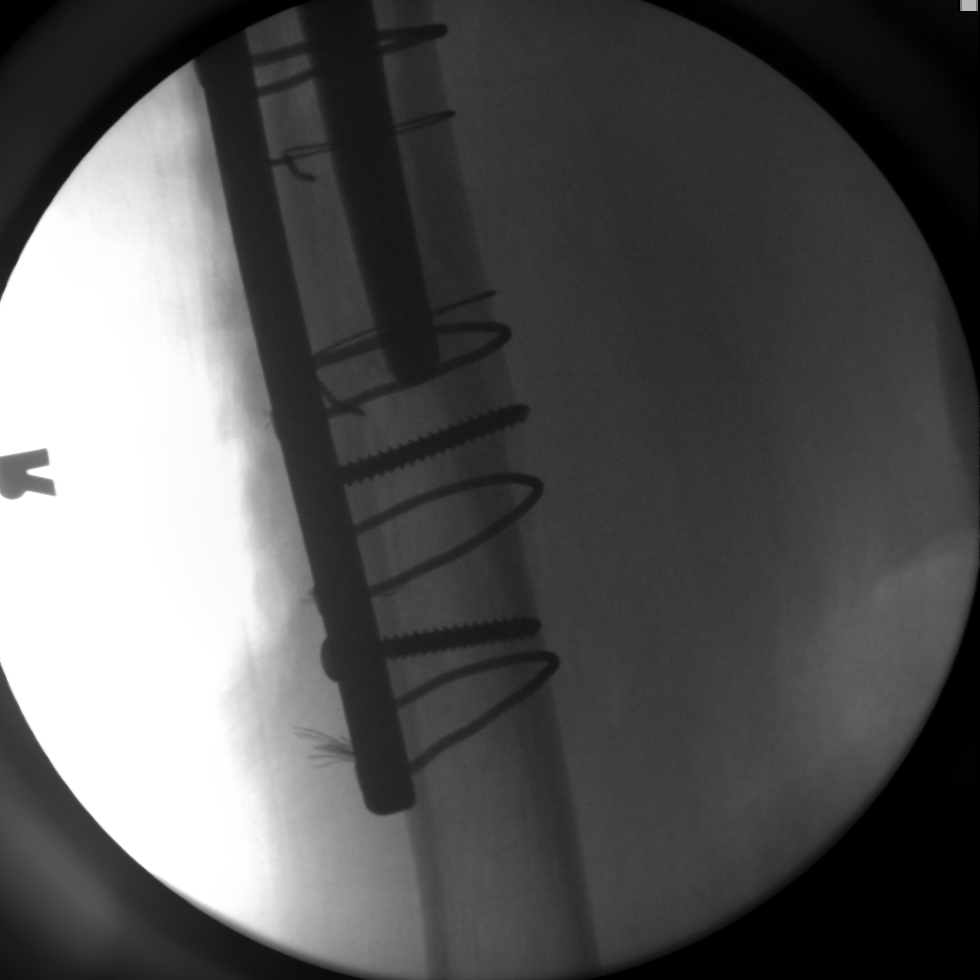

[4 of 4 positions shown; findings below may reference images not displayed]

FINDINGS: 4 intraoperative fluoroscopic spot films demonstrate placement of
the lateral plate with encircling cables. Lateral plate has at least
2 screws in the diaphysis. RIGHT total hip arthroplasty incidentally
noted.
IMPRESSION: ORIF of proximal RIGHT femur fracture.
